# Patient Record
Sex: Female | Born: 1945
Health system: Southern US, Community
[De-identification: ages and names within clinical notes are randomized; demographics above are authoritative.]

## PROBLEM LIST (undated history)

## (undated) DIAGNOSIS — R112 Nausea with vomiting, unspecified: Secondary | ICD-10-CM

## (undated) DIAGNOSIS — K635 Polyp of colon: Secondary | ICD-10-CM

## (undated) DIAGNOSIS — K52839 Microscopic colitis, unspecified: Secondary | ICD-10-CM

## (undated) DIAGNOSIS — E7800 Pure hypercholesterolemia, unspecified: Secondary | ICD-10-CM

## (undated) DIAGNOSIS — Z9889 Other specified postprocedural states: Secondary | ICD-10-CM

## (undated) DIAGNOSIS — K602 Anal fissure, unspecified: Secondary | ICD-10-CM

## (undated) DIAGNOSIS — K648 Other hemorrhoids: Secondary | ICD-10-CM

## (undated) DIAGNOSIS — M1990 Unspecified osteoarthritis, unspecified site: Secondary | ICD-10-CM

## (undated) DIAGNOSIS — K589 Irritable bowel syndrome without diarrhea: Secondary | ICD-10-CM

## (undated) DIAGNOSIS — N83202 Unspecified ovarian cyst, left side: Secondary | ICD-10-CM

## (undated) DIAGNOSIS — E039 Hypothyroidism, unspecified: Secondary | ICD-10-CM

## (undated) DIAGNOSIS — R55 Syncope and collapse: Secondary | ICD-10-CM

## (undated) HISTORY — DX: Polyp of colon: K63.5

## (undated) HISTORY — PX: GYNECOLOGIC CRYOSURGERY: SHX857

## (undated) HISTORY — PX: CATARACT EXTRACTION, BILATERAL: SHX1313

## (undated) HISTORY — DX: Hypothyroidism, unspecified: E03.9

## (undated) HISTORY — DX: Unspecified osteoarthritis, unspecified site: M19.90

## (undated) HISTORY — PX: EYE SURGERY: SHX253

## (undated) HISTORY — DX: Pure hypercholesterolemia, unspecified: E78.00

## (undated) HISTORY — PX: BREAST BIOPSY: SHX20

## (undated) HISTORY — DX: Anal fissure, unspecified: K60.2

## (undated) HISTORY — PX: COLPOSCOPY: SHX161

## (undated) HISTORY — PX: COLONOSCOPY: SHX174

## (undated) HISTORY — PX: UPPER GI ENDOSCOPY: SHX6162

## (undated) HISTORY — DX: Irritable bowel syndrome, unspecified: K58.9

---

## 1998-06-03 ENCOUNTER — Other Ambulatory Visit: Admission: RE | Admit: 1998-06-03 | Discharge: 1998-06-03 | Payer: Self-pay | Admitting: Obstetrics and Gynecology

## 1999-07-05 ENCOUNTER — Other Ambulatory Visit: Admission: RE | Admit: 1999-07-05 | Discharge: 1999-07-05 | Payer: Self-pay | Admitting: Obstetrics and Gynecology

## 1999-09-16 ENCOUNTER — Encounter: Payer: Self-pay | Admitting: Obstetrics and Gynecology

## 1999-09-16 ENCOUNTER — Encounter: Admission: RE | Admit: 1999-09-16 | Discharge: 1999-09-16 | Payer: Self-pay | Admitting: Obstetrics and Gynecology

## 2000-04-20 ENCOUNTER — Encounter: Payer: Self-pay | Admitting: Obstetrics and Gynecology

## 2000-04-20 ENCOUNTER — Encounter: Admission: RE | Admit: 2000-04-20 | Discharge: 2000-04-20 | Payer: Self-pay | Admitting: Obstetrics and Gynecology

## 2000-07-26 ENCOUNTER — Other Ambulatory Visit: Admission: RE | Admit: 2000-07-26 | Discharge: 2000-07-26 | Payer: Self-pay | Admitting: Obstetrics and Gynecology

## 2001-05-16 ENCOUNTER — Encounter: Admission: RE | Admit: 2001-05-16 | Discharge: 2001-05-16 | Payer: Self-pay | Admitting: Obstetrics and Gynecology

## 2001-05-16 ENCOUNTER — Encounter: Payer: Self-pay | Admitting: Obstetrics and Gynecology

## 2001-10-09 ENCOUNTER — Other Ambulatory Visit: Admission: RE | Admit: 2001-10-09 | Discharge: 2001-10-09 | Payer: Self-pay | Admitting: Obstetrics and Gynecology

## 2002-05-27 ENCOUNTER — Encounter: Admission: RE | Admit: 2002-05-27 | Discharge: 2002-05-27 | Payer: Self-pay | Admitting: Obstetrics and Gynecology

## 2002-05-27 ENCOUNTER — Encounter: Payer: Self-pay | Admitting: Obstetrics and Gynecology

## 2002-12-26 ENCOUNTER — Other Ambulatory Visit: Admission: RE | Admit: 2002-12-26 | Discharge: 2002-12-26 | Payer: Self-pay | Admitting: Obstetrics and Gynecology

## 2003-06-12 ENCOUNTER — Encounter: Payer: Self-pay | Admitting: Obstetrics and Gynecology

## 2003-06-12 ENCOUNTER — Encounter: Admission: RE | Admit: 2003-06-12 | Discharge: 2003-06-12 | Payer: Self-pay | Admitting: Obstetrics and Gynecology

## 2004-01-02 ENCOUNTER — Other Ambulatory Visit: Admission: RE | Admit: 2004-01-02 | Discharge: 2004-01-02 | Payer: Self-pay | Admitting: Obstetrics and Gynecology

## 2004-07-22 ENCOUNTER — Encounter: Admission: RE | Admit: 2004-07-22 | Discharge: 2004-07-22 | Payer: Self-pay | Admitting: Obstetrics and Gynecology

## 2005-01-24 ENCOUNTER — Other Ambulatory Visit: Admission: RE | Admit: 2005-01-24 | Discharge: 2005-01-24 | Payer: Self-pay | Admitting: Addiction Medicine

## 2005-08-15 ENCOUNTER — Encounter: Admission: RE | Admit: 2005-08-15 | Discharge: 2005-08-15 | Payer: Self-pay | Admitting: Obstetrics and Gynecology

## 2006-01-25 ENCOUNTER — Other Ambulatory Visit: Admission: RE | Admit: 2006-01-25 | Discharge: 2006-01-25 | Payer: Self-pay | Admitting: Obstetrics and Gynecology

## 2006-04-24 ENCOUNTER — Ambulatory Visit: Payer: Self-pay | Admitting: Internal Medicine

## 2006-05-24 ENCOUNTER — Ambulatory Visit: Payer: Self-pay | Admitting: Internal Medicine

## 2006-05-24 ENCOUNTER — Encounter (INDEPENDENT_AMBULATORY_CARE_PROVIDER_SITE_OTHER): Payer: Self-pay | Admitting: Specialist

## 2006-09-07 ENCOUNTER — Encounter: Admission: RE | Admit: 2006-09-07 | Discharge: 2006-09-07 | Payer: Self-pay | Admitting: Obstetrics and Gynecology

## 2006-09-19 ENCOUNTER — Emergency Department (HOSPITAL_COMMUNITY): Admission: EM | Admit: 2006-09-19 | Discharge: 2006-09-19 | Payer: Self-pay | Admitting: Family Medicine

## 2007-02-20 ENCOUNTER — Other Ambulatory Visit: Admission: RE | Admit: 2007-02-20 | Discharge: 2007-02-20 | Payer: Self-pay | Admitting: Obstetrics and Gynecology

## 2007-04-12 ENCOUNTER — Other Ambulatory Visit: Admission: RE | Admit: 2007-04-12 | Discharge: 2007-04-12 | Payer: Self-pay | Admitting: Obstetrics and Gynecology

## 2007-07-10 ENCOUNTER — Ambulatory Visit: Payer: Self-pay | Admitting: Internal Medicine

## 2007-07-10 LAB — CONVERTED CEMR LAB: Tissue Transglutaminase Ab, IgA: 0.2 units (ref <7)

## 2007-10-08 ENCOUNTER — Encounter: Admission: RE | Admit: 2007-10-08 | Discharge: 2007-10-08 | Payer: Self-pay | Admitting: Obstetrics and Gynecology

## 2007-12-13 DIAGNOSIS — K589 Irritable bowel syndrome without diarrhea: Secondary | ICD-10-CM

## 2007-12-13 DIAGNOSIS — E0590 Thyrotoxicosis, unspecified without thyrotoxic crisis or storm: Secondary | ICD-10-CM | POA: Insufficient documentation

## 2008-03-20 ENCOUNTER — Other Ambulatory Visit: Admission: RE | Admit: 2008-03-20 | Discharge: 2008-03-20 | Payer: Self-pay | Admitting: Obstetrics and Gynecology

## 2008-10-27 ENCOUNTER — Encounter: Admission: RE | Admit: 2008-10-27 | Discharge: 2008-10-27 | Payer: Self-pay | Admitting: Obstetrics and Gynecology

## 2009-03-26 ENCOUNTER — Other Ambulatory Visit: Admission: RE | Admit: 2009-03-26 | Discharge: 2009-03-26 | Payer: Self-pay | Admitting: Obstetrics and Gynecology

## 2009-03-26 ENCOUNTER — Ambulatory Visit: Payer: Self-pay | Admitting: Obstetrics and Gynecology

## 2009-03-26 ENCOUNTER — Encounter: Payer: Self-pay | Admitting: Obstetrics and Gynecology

## 2009-05-12 ENCOUNTER — Ambulatory Visit: Payer: Self-pay | Admitting: Obstetrics and Gynecology

## 2009-06-08 ENCOUNTER — Ambulatory Visit: Payer: Self-pay | Admitting: Surgery

## 2009-06-08 ENCOUNTER — Ambulatory Visit (HOSPITAL_COMMUNITY): Admission: RE | Admit: 2009-06-08 | Discharge: 2009-06-08 | Payer: Self-pay | Admitting: Internal Medicine

## 2009-06-08 ENCOUNTER — Encounter (INDEPENDENT_AMBULATORY_CARE_PROVIDER_SITE_OTHER): Payer: Self-pay | Admitting: Internal Medicine

## 2009-10-29 ENCOUNTER — Encounter: Admission: RE | Admit: 2009-10-29 | Discharge: 2009-10-29 | Payer: Self-pay | Admitting: Obstetrics and Gynecology

## 2010-04-08 ENCOUNTER — Ambulatory Visit: Payer: Self-pay | Admitting: Obstetrics and Gynecology

## 2010-04-08 ENCOUNTER — Other Ambulatory Visit: Admission: RE | Admit: 2010-04-08 | Discharge: 2010-04-08 | Payer: Self-pay | Admitting: Obstetrics and Gynecology

## 2010-04-13 ENCOUNTER — Ambulatory Visit: Payer: Self-pay | Admitting: Obstetrics and Gynecology

## 2010-10-23 ENCOUNTER — Other Ambulatory Visit: Payer: Self-pay | Admitting: Obstetrics and Gynecology

## 2010-10-23 DIAGNOSIS — Z1239 Encounter for other screening for malignant neoplasm of breast: Secondary | ICD-10-CM

## 2010-11-18 ENCOUNTER — Ambulatory Visit
Admission: RE | Admit: 2010-11-18 | Discharge: 2010-11-18 | Disposition: A | Discharge status: Home / Self Care | Payer: 59 | Source: Ambulatory Visit | Attending: Obstetrics and Gynecology | Admitting: Obstetrics and Gynecology

## 2010-11-18 DIAGNOSIS — Z1239 Encounter for other screening for malignant neoplasm of breast: Secondary | ICD-10-CM

## 2010-12-23 LAB — IFOBT (OCCULT BLOOD): IFOBT: POSITIVE

## 2010-12-28 ENCOUNTER — Encounter: Payer: Self-pay | Admitting: Internal Medicine

## 2011-01-04 NOTE — Letter (Signed)
Summary: New Patient letter  [facility]  [address] [place], [place] [address]   Phone: [phone]  Fax: [phone]       12/28/2010 MRN: [id number]  Vanessa Larsen [address] [place], [place]  [address]  Dear Vanessa Larsen,  Welcome to the Gastroenterology Division at [facility]    You are scheduled to see Dr.  Juanda Chance on 02-18-11 at 1:30pm on the 3rd floor at [facility], [address]  We ask that you try to arrive at our office 15 minutes prior to your appointment time to allow for check-in.  We would like you to complete the enclosed self-administered evaluation form prior to your visit and bring it with you on the day of your appointment.  We will review it with you.  Also, please bring a complete list of all your medications or, if you prefer, bring the medication bottles and we will list them.  Please bring your insurance card so that we may make a copy of it.  If your insurance requires a referral to see a specialist, please bring your referral form from your primary care physician.  Co-payments are due at the time of your visit and may be paid by cash, check or credit card.     Your office visit will consist of a consult with your physician (includes a physical exam), any laboratory testing he/she may order, scheduling of any necessary diagnostic testing (e.g. x-ray, ultrasound, CT-scan), and scheduling of a procedure (e.g. Endoscopy, Colonoscopy) if required.  Please allow enough time on your schedule to allow for any/all of these possibilities.    If you cannot keep your appointment, please call [phone] to cancel or reschedule prior to your appointment date.  This allows [place] the opportunity to schedule an appointment for another patient in need of care.  If you do not cancel or reschedule by 5 p.m. the business day prior to your appointment date, you will be charged a $50.00 late cancellation/no-show fee.    Thank you for choosing [facility] for your medical needs.  We appreciate the opportunity to care for you.  Please visit [place] at our website  to learn more about our practice.                     Sincerely,                                                             The Gastroenterology Division

## 2011-02-15 NOTE — Assessment & Plan Note (Signed)
[facility]                         GASTROENTEROLOGY OFFICE NOTE   NAME:Vanessa Larsen, Vanessa Larsen                      MRN:          [id number]  DATE:07/10/2007                            DOB:          [birth year]    Vanessa Larsen is a 65 year old white female who we saw last year for screening  colonoscopy. She has a history of irritable bowel syndrome and was  having some diarrhea and frequency of stools. Her colon exam was  essentially normal including random biopsies of the colon which showed  no evidence of microscopic colitis. Her next colonoscopy would be in 10  years; that is in 2017. She comes back today because she has had at  least five episodes of incontinent diarrheal stool at night. Last  episode was this past summer; prior to that, the year before. It happens  very rarely, and there is usually no warning. She has not been able to  correlate with any type of food. During the day, she is having regular  bowel movements on a high-fiber diet. She is physically active. Her  weight has been stable. She does not smoke, does not drink excess  alcohol. She denies abdominal pain. There is some family history of  irregular bowel habits in both mother and father; both died of  pancreatic CA and gastric CA, respectively. There is no lactose allergy.  Vanessa Larsen has noted, however, that Splenda gives her diarrhea.   MEDICATIONS:  1. Levoxyl 75 mcg daily.  2. Multivitamins daily.  3. Calcium supplements.   PHYSICAL EXAMINATION:  Blood pressure 116/88, pulse 84, weight 144  pounds. She was healthy appearing, alert and oriented.  LUNGS:  Were clear to auscultation.  COR:  With normal S1 and normal S2.  ABDOMEN:  Soft. There was good muscle support. Normal active bowel  sounds. No tenderness. Liver edge at costal margin. No scars.  RECTAL EXAM:  Shows normal appearing anal area. Rectal tone was normal  to slightly decreased. There was a moderate amount of Hemoccult  negative  stool in the ampulla.   Vanessa Larsen also gives me history of rectal trauma during her first  pregnancy, associated with episiotomy. She apparently had some rectal  injury but really did not have any problems with continence in the last  one or two years.   IMPRESSION:  A 65 year old white  female with episodes of nocturnal  diarrhea and incontinence. It is probably related to either  bacterial  overgrowth combined with slightly incompetent rectal sphincter or to an  irritable bowel syndrome, although the diarrhea of IBS usually occurs  during the day. Since there is some family history of irregular bowel  habits and food intolerance, I would check her for celiac sprue by  obtaining  tissue glutaminase.  I suggested a high-fiber diet, but she  has already been on it. I would like for her take  to probiotics to  normalize her bacterial flora.   PLAN:  1. Tissue glutaminase level today.  2. Probiotic, Align, 1 p.o. daily.  3. Increase fiber in the food. If the symptoms become more frequent  and more bothersome, we could put her on a bedtime dose of      antispasmodic or on additional fiber such as Benefiber a tablespoon      daily. She prefers just to stay on probiotics at this time.     Hedwig Morton. Juanda Chance, MD  Electronically Signed    DMB/MedQ  DD: 07/10/2007  DT: 07/10/2007  Job #: [id number]   cc:   Reuel Boom L. Eda Paschal, M.D.

## 2011-02-18 ENCOUNTER — Ambulatory Visit: Payer: 59 | Admitting: Internal Medicine

## 2011-03-01 ENCOUNTER — Encounter: Payer: Self-pay | Admitting: Internal Medicine

## 2011-03-07 ENCOUNTER — Ambulatory Visit (INDEPENDENT_AMBULATORY_CARE_PROVIDER_SITE_OTHER): Payer: 59 | Admitting: Internal Medicine

## 2011-03-07 ENCOUNTER — Encounter: Payer: Self-pay | Admitting: Internal Medicine

## 2011-03-07 VITALS — BP 130/84 | HR 76 | Ht 69.5 in | Wt 143.6 lb

## 2011-03-07 DIAGNOSIS — R195 Other fecal abnormalities: Secondary | ICD-10-CM

## 2011-03-07 DIAGNOSIS — K602 Anal fissure, unspecified: Secondary | ICD-10-CM

## 2011-03-07 MED ORDER — PEG-KCL-NACL-NASULF-NA ASC-C 100 G PO SOLR: 1.0000 | Freq: Once | ORAL | Status: AC

## 2011-03-07 NOTE — Patient Instructions (Addendum)
You have been scheduled for an endoscopy and colonoscopy. Please follow the written instructions given to you at your visit today. Please pick up your Moviprep in the next 2-3 days. Dr Eric Form

## 2011-03-07 NOTE — Progress Notes (Signed)
Vanessa Larsen 1945/11/19 MRN [id number]     History of Present Illness:  This is a 65 year old white female with a history of irritable bowel syndrome with predominant diarrhea and a family history of pancreatic cancer in her father and esophageal cancer in her mother. She had a positive home stool test for occult blood. We saw her in 2007 for a colonoscopy which was essentially normal except for a slightly decreased rectal sphincter tone. She denies any visible blood per rectum. She takes ibuprofen 200 mg almost daily. She denies gastroesophageal reflux, dysphagia or abdominal pain. Her sister had H. pylori gastropathy. Her recent hemoglobin was 14.2   Past Medical History  Diagnosis Date  . IBS (irritable bowel syndrome)    Past Surgical History  Procedure Date  . None     reports that she has quit smoking. She does not have any smokeless tobacco history on file. She reports that she drinks alcohol. She reports that she does not use illicit drugs. family history includes Diabetes in her cousin; Esophageal cancer in her mother; Heart disease in her father and paternal grandmother; and Pancreatic cancer in her father.  There is no history of Colon cancer. No Known Allergies      Review of Systems: Denies dysphagia, odynophagia, shortness of breath or chest pain. Positive for occasional fecal urgency. Negative for hematochezia  The remainder of the 10  point ROS is negative except as outlined in H&P   Physical Exam: General appearance  Well developed, in no distress. Eyes- non icteric. HEENT nontraumatic, normocephalic. Mouth no lesions, tongue papillated, no cheilosis. Neck supple without adenopathy, thyroid not enlarged, no carotid bruits, no JVD. Lungs Clear to auscultation bilaterally. Cor normal S1 normal S2, regular rhythm , no murmur,  quiet precordium. Abdomen soft relaxed abdomen with normoactive bowel sounds. No focal tenderness or mass. Rectal anoscopic exam reveals  no internal hemorrhoids. Rectal sphincter tone was normal to slightly decreased. Stool is dark and slightly Hemoccult-positive. There is a small anal fissure at 9:00 which measures 1 cm and it is superficial. It is not associated with pain. Extremities no pedal edema. Skin no lesions. Neurological alert and oriented x 3. Psychological normal mood and affect.  Assessment and Plan:  Problem # 1 Heme-positive stool on home test and on today's exam. It could be related to the presence of a small anal fissure. It could also be related to ibuprofen gastropathy. She is asymptomatic. It has been 5 years since her last colonoscopy. Her mother died of esophageal cancer. Because of her high risk for GI-related cancer, we will proceed with an upper endoscopy and colonoscopy. We would plan to check H. pylori as well as for Barrett's esophagus. Anal fissure seems to be asymptomatic.   03/07/2011 Lina Sar

## 2011-04-08 ENCOUNTER — Encounter: Payer: Self-pay | Admitting: Internal Medicine

## 2011-04-08 ENCOUNTER — Ambulatory Visit (AMBULATORY_SURGERY_CENTER): Payer: 59 | Admitting: Internal Medicine

## 2011-04-08 VITALS — BP 125/70 | HR 79 | Temp 99.0°F | Resp 12

## 2011-04-08 DIAGNOSIS — K219 Gastro-esophageal reflux disease without esophagitis: Secondary | ICD-10-CM

## 2011-04-08 DIAGNOSIS — R195 Other fecal abnormalities: Secondary | ICD-10-CM

## 2011-04-08 DIAGNOSIS — D126 Benign neoplasm of colon, unspecified: Secondary | ICD-10-CM

## 2011-04-08 DIAGNOSIS — K921 Melena: Secondary | ICD-10-CM

## 2011-04-08 DIAGNOSIS — Z800 Family history of malignant neoplasm of digestive organs: Secondary | ICD-10-CM

## 2011-04-08 MED ORDER — SODIUM CHLORIDE 0.9 % IV SOLN: 500.0000 mL | INTRAVENOUS | Status: DC

## 2011-04-08 MED ORDER — HYOSCYAMINE SULFATE 0.125 MG SL SUBL: 0.1250 mg | SUBLINGUAL_TABLET | SUBLINGUAL | Status: AC | PRN

## 2011-04-08 NOTE — Patient Instructions (Addendum)
FOLLOW DISCHARGE INSTRUCTIONS (BLUE & GREEN SHEETS)   INFORMATION ON POLYPS AND HIGH FIBER DIET GIVEN    AWAIT BIOPSY RESULTS   LEVSIN FOR IBS SENT TO YOUR PHARMACY AS ORDERED BY DR Juanda Chance

## 2011-04-11 ENCOUNTER — Telehealth: Payer: Self-pay

## 2011-04-11 NOTE — Telephone Encounter (Signed)
I called the hm # and the a.m. Picked up but not ID on it.  I then called the pt's cell # which she ID herself as Vanessa Larsen.  I left message for the pt to call if any questions or concerns.  MAW

## 2011-04-12 ENCOUNTER — Encounter: Payer: 59 | Admitting: Obstetrics and Gynecology

## 2011-04-14 ENCOUNTER — Encounter (INDEPENDENT_AMBULATORY_CARE_PROVIDER_SITE_OTHER): Payer: 59 | Admitting: Obstetrics and Gynecology

## 2011-04-14 ENCOUNTER — Other Ambulatory Visit (HOSPITAL_COMMUNITY)
Admission: RE | Admit: 2011-04-14 | Discharge: 2011-04-14 | Disposition: A | Discharge status: Home / Self Care | Payer: 59 | Source: Ambulatory Visit | Attending: Obstetrics and Gynecology | Admitting: Obstetrics and Gynecology

## 2011-04-14 ENCOUNTER — Other Ambulatory Visit: Payer: Self-pay | Admitting: Obstetrics and Gynecology

## 2011-04-14 DIAGNOSIS — Z124 Encounter for screening for malignant neoplasm of cervix: Secondary | ICD-10-CM | POA: Insufficient documentation

## 2011-04-14 DIAGNOSIS — Z01419 Encounter for gynecological examination (general) (routine) without abnormal findings: Secondary | ICD-10-CM

## 2011-04-16 ENCOUNTER — Encounter: Payer: Self-pay | Admitting: Internal Medicine

## 2011-04-26 ENCOUNTER — Other Ambulatory Visit: Payer: 59

## 2011-04-26 ENCOUNTER — Other Ambulatory Visit: Payer: Self-pay

## 2011-04-26 ENCOUNTER — Ambulatory Visit (INDEPENDENT_AMBULATORY_CARE_PROVIDER_SITE_OTHER): Payer: 59 | Admitting: Obstetrics and Gynecology

## 2011-04-26 DIAGNOSIS — N83339 Acquired atrophy of ovary and fallopian tube, unspecified side: Secondary | ICD-10-CM

## 2011-04-26 DIAGNOSIS — N83209 Unspecified ovarian cyst, unspecified side: Secondary | ICD-10-CM

## 2011-04-26 DIAGNOSIS — D259 Leiomyoma of uterus, unspecified: Secondary | ICD-10-CM

## 2011-04-26 NOTE — Progress Notes (Signed)
The patient came back today for follow up ultrasound because of an adnexal mass. On ultrasound today she is a normal uterus with a thin endometrial cavity a 1.6 mm. Her previous myoma that we've also been watching is now gone. I suspected disappeared because of lack of estrogen. On her right ovary there is no cyst whatsoever. On her left ovary she continues to have a thick walled cyst. It has decreased in size however and is now 4 x 5 mm. It is negative for PFT. Her cul-de-sac is free of fluid.   plan she was reassured that the cyst is stable. She will call with new symptoms. We will rescan her in approximately one year.

## 2011-04-28 ENCOUNTER — Telehealth: Payer: Self-pay | Admitting: Internal Medicine

## 2011-04-28 NOTE — Telephone Encounter (Signed)
Patient received a letter with Colonoscopy results and she wants the EGD results. Please, advise.

## 2011-05-01 ENCOUNTER — Encounter: Payer: Self-pay | Admitting: Internal Medicine

## 2011-05-03 NOTE — Telephone Encounter (Signed)
I have mailed letter several days ago.

## 2011-05-17 ENCOUNTER — Telehealth: Payer: Self-pay | Admitting: Internal Medicine

## 2011-05-17 NOTE — Telephone Encounter (Signed)
Patient just received her results from [facility] and has questions. 1. Since her mother had esophageal cancer, should she be on something for reflux even though she does not have any symptoms of reflux? 2. Should she have another EGD because of her mother's cancer? 3. Since she had heme + stools and nothing was found to account for it, should she avoid NSAIDS?

## 2011-05-19 NOTE — Telephone Encounter (Signed)
I have spoken with the pt at length . All questions answered. DB

## 2011-06-22 ENCOUNTER — Encounter: Payer: Self-pay | Admitting: Obstetrics and Gynecology

## 2011-10-26 ENCOUNTER — Other Ambulatory Visit: Payer: Self-pay | Admitting: Obstetrics and Gynecology

## 2011-10-26 DIAGNOSIS — Z1231 Encounter for screening mammogram for malignant neoplasm of breast: Secondary | ICD-10-CM

## 2011-11-24 ENCOUNTER — Ambulatory Visit
Admission: RE | Admit: 2011-11-24 | Discharge: 2011-11-24 | Disposition: A | Discharge status: Home / Self Care | Payer: 59 | Source: Ambulatory Visit | Attending: Obstetrics and Gynecology | Admitting: Obstetrics and Gynecology

## 2011-11-24 DIAGNOSIS — Z1231 Encounter for screening mammogram for malignant neoplasm of breast: Secondary | ICD-10-CM

## 2012-05-02 ENCOUNTER — Encounter: Payer: 59 | Admitting: Obstetrics and Gynecology

## 2012-05-22 ENCOUNTER — Ambulatory Visit (INDEPENDENT_AMBULATORY_CARE_PROVIDER_SITE_OTHER): Payer: 59 | Admitting: Obstetrics and Gynecology

## 2012-05-22 ENCOUNTER — Encounter: Payer: Self-pay | Admitting: Obstetrics and Gynecology

## 2012-05-22 VITALS — BP 130/84 | Ht 69.0 in | Wt 140.0 lb

## 2012-05-22 DIAGNOSIS — R102 Pelvic and perineal pain: Secondary | ICD-10-CM

## 2012-05-22 DIAGNOSIS — N949 Unspecified condition associated with female genital organs and menstrual cycle: Secondary | ICD-10-CM

## 2012-05-22 DIAGNOSIS — Z01419 Encounter for gynecological examination (general) (routine) without abnormal findings: Secondary | ICD-10-CM

## 2012-05-22 NOTE — Patient Instructions (Signed)
Schedule pelvic ultrasound

## 2012-05-22 NOTE — Progress Notes (Signed)
Patient came to see me today for her annual GYN exam. She is doing well without HRT in terms of menopausal symptoms but she does have vaginal dryness causing dyspareunia. She is up-to-date on mammograms. She does get intermittent lower abdominal discomfort that we can't distinguish from IBS versus ovarian origin. She is having no vaginal bleeding. She has had 3-to 4 normal bone densities with her last one in August, 2010. In 1989 she had cervical dysplasia and had cryosurgery. After that she had another abnormal Pap and had a normal colposcopy and Dr. Guilford Shi  Read her pap and felt it  Was reactive atypia. Since 1992 she has had yearly normal Pap smears including one in 2012. She does her lab work through her PCP.  Physical examination: Vanessa Larsen present. HEENT within normal limits. Neck: Thyroid not large. No masses. Supraclavicular nodes: not enlarged. Breasts: Examined in both sitting and lying  position. No skin changes and no masses. Abdomen: Soft no guarding rebound or masses or hernia. Pelvic: External: Within normal limits. BUS: Within normal limits. Vaginal:within normal limits. Poor  estrogen effect. No evidence of cystocele rectocele or enterocele. Cervix: clean. Uterus: Normal size and shape. Adnexa: No masses. Rectovaginal exam: Confirmatory and negative. Extremities: Within normal limits.  Assessment: #1. Abdominal discomfort #2. Symptomatic atrophic vaginitis #3. Previous CIN  Plan: Pelvic ultrasound. Start patient on Estrace vaginal cream 1 g at bedtime in her vagina for 2 weeks and then 3 times a week.The new Pap smear guidelines were discussed with the patient. No pap done.

## 2012-05-28 ENCOUNTER — Ambulatory Visit (INDEPENDENT_AMBULATORY_CARE_PROVIDER_SITE_OTHER): Payer: 59 | Admitting: Obstetrics and Gynecology

## 2012-05-28 ENCOUNTER — Ambulatory Visit (INDEPENDENT_AMBULATORY_CARE_PROVIDER_SITE_OTHER): Payer: 59

## 2012-05-28 DIAGNOSIS — N949 Unspecified condition associated with female genital organs and menstrual cycle: Secondary | ICD-10-CM

## 2012-05-28 DIAGNOSIS — R102 Pelvic and perineal pain: Secondary | ICD-10-CM

## 2012-05-28 DIAGNOSIS — N83209 Unspecified ovarian cyst, unspecified side: Secondary | ICD-10-CM

## 2012-05-28 DIAGNOSIS — N831 Corpus luteum cyst of ovary, unspecified side: Secondary | ICD-10-CM

## 2012-05-28 NOTE — Patient Instructions (Addendum)
Continue yearly ultrasounds.

## 2012-05-28 NOTE — Progress Notes (Signed)
Patient came back today for followup ultrasound. When she had her ultrasound in 2007 all was normal except for small fibroid. In 2009 all was  normal except for a small thick walled cyst on her left ovary which was  slightly greater than 1 cm with a cystic lumen. We felt it was just a follicle. It did not have blood flow. In 2011 it remained stable at 1.1 cm. In 2012 it was seen and  was only 4 mm. Today it is still present and was about 9 mm. It remains avascular. The rest of her ultrasound reveals a normal uterus without the previous myoma being seen. Her endometrial echo is 0.9 mm. Right ovary is completely normal. Her left ovary is normal except what we think is this persistent follicle or small cyst. Her cul-de-sac is free of fluid. We had a very long discussion about it. I've told her that I do not believe this could be an ovarian cancer based on stability over 4 years, no vascularity, and the fact that it  gets smaller on occasion. We discussed doing a CA 125 but also discussed a false positive result. We discussed surgical removal and the fact that benefits are outweighed by risks. We are both comfortable watching it and she will have another ultrasound in one year.

## 2012-10-23 ENCOUNTER — Other Ambulatory Visit: Payer: Self-pay | Admitting: Gynecology

## 2012-10-23 ENCOUNTER — Other Ambulatory Visit: Payer: Self-pay | Admitting: Obstetrics and Gynecology

## 2012-10-23 DIAGNOSIS — Z1231 Encounter for screening mammogram for malignant neoplasm of breast: Secondary | ICD-10-CM

## 2012-11-28 ENCOUNTER — Ambulatory Visit
Admission: RE | Admit: 2012-11-28 | Discharge: 2012-11-28 | Disposition: A | Discharge status: Home / Self Care | Payer: 59 | Source: Ambulatory Visit | Attending: Gynecology | Admitting: Gynecology

## 2012-11-28 DIAGNOSIS — Z1231 Encounter for screening mammogram for malignant neoplasm of breast: Secondary | ICD-10-CM

## 2012-12-03 ENCOUNTER — Other Ambulatory Visit: Payer: Self-pay | Admitting: *Deleted

## 2012-12-03 ENCOUNTER — Other Ambulatory Visit: Payer: Self-pay | Admitting: Gynecology

## 2012-12-03 DIAGNOSIS — R928 Other abnormal and inconclusive findings on diagnostic imaging of breast: Secondary | ICD-10-CM

## 2012-12-03 DIAGNOSIS — N6320 Unspecified lump in the left breast, unspecified quadrant: Secondary | ICD-10-CM

## 2012-12-06 ENCOUNTER — Other Ambulatory Visit: Payer: Self-pay | Admitting: Dermatology

## 2012-12-11 ENCOUNTER — Ambulatory Visit
Admission: RE | Admit: 2012-12-11 | Discharge: 2012-12-11 | Disposition: A | Discharge status: Home / Self Care | Payer: 59 | Source: Ambulatory Visit | Attending: Gynecology | Admitting: Gynecology

## 2012-12-11 ENCOUNTER — Other Ambulatory Visit: Payer: Self-pay | Admitting: Gynecology

## 2012-12-11 DIAGNOSIS — R928 Other abnormal and inconclusive findings on diagnostic imaging of breast: Secondary | ICD-10-CM

## 2012-12-11 DIAGNOSIS — N6320 Unspecified lump in the left breast, unspecified quadrant: Secondary | ICD-10-CM

## 2012-12-11 HISTORY — PX: BREAST BIOPSY: SHX20

## 2013-01-17 ENCOUNTER — Other Ambulatory Visit: Payer: Self-pay | Admitting: Dermatology

## 2013-04-10 ENCOUNTER — Other Ambulatory Visit: Payer: Self-pay | Admitting: *Deleted

## 2013-04-10 DIAGNOSIS — N83209 Unspecified ovarian cyst, unspecified side: Secondary | ICD-10-CM

## 2013-05-14 ENCOUNTER — Other Ambulatory Visit: Payer: Self-pay | Admitting: Gynecology

## 2013-05-14 DIAGNOSIS — N63 Unspecified lump in unspecified breast: Secondary | ICD-10-CM

## 2013-05-24 ENCOUNTER — Ambulatory Visit: Payer: Self-pay | Admitting: Gynecology

## 2013-05-24 ENCOUNTER — Other Ambulatory Visit: Payer: Self-pay

## 2013-05-29 ENCOUNTER — Encounter: Payer: Self-pay | Admitting: Gynecology

## 2013-05-29 ENCOUNTER — Ambulatory Visit (INDEPENDENT_AMBULATORY_CARE_PROVIDER_SITE_OTHER): Payer: 59 | Admitting: Gynecology

## 2013-05-29 ENCOUNTER — Ambulatory Visit (INDEPENDENT_AMBULATORY_CARE_PROVIDER_SITE_OTHER): Payer: 59

## 2013-05-29 VITALS — BP 114/72 | Ht 69.0 in | Wt 140.0 lb

## 2013-05-29 DIAGNOSIS — N83209 Unspecified ovarian cyst, unspecified side: Secondary | ICD-10-CM

## 2013-05-29 DIAGNOSIS — N839 Noninflammatory disorder of ovary, fallopian tube and broad ligament, unspecified: Secondary | ICD-10-CM

## 2013-05-29 DIAGNOSIS — N83339 Acquired atrophy of ovary and fallopian tube, unspecified side: Secondary | ICD-10-CM

## 2013-05-29 DIAGNOSIS — N952 Postmenopausal atrophic vaginitis: Secondary | ICD-10-CM

## 2013-05-29 DIAGNOSIS — N950 Postmenopausal bleeding: Secondary | ICD-10-CM

## 2013-05-29 DIAGNOSIS — Z01419 Encounter for gynecological examination (general) (routine) without abnormal findings: Secondary | ICD-10-CM

## 2013-05-29 NOTE — Progress Notes (Addendum)
Vanessa Larsen 09/06/1946 [id number]        67 y.o.  G3P3003 for annual exam.  Former patient of Dr. Eda Paschal. Several issues noted below.  Past medical history,surgical history, medications, allergies, family history and social history were all reviewed and documented in the EPIC chart.  ROS:  Performed and pertinent positives and negatives are included in the history, assessment and plan .  Exam: [name] assistant Filed Vitals:   05/29/13 1001  BP: 114/72  Height: 5\' 9"  (1.753 m)  Weight: 140 lb (63.504 kg)   General appearance  Normal Skin grossly normal Head/Neck normal with no cervical or supraclavicular adenopathy thyroid normal Lungs  clear Cardiac RR, without RMG Abdominal  soft, nontender, without masses, organomegaly or hernia Breasts  examined lying and sitting without masses, retractions, discharge or axillary adenopathy. Pelvic  Ext/BUS/vagina  normal with atrophic changes  Cervix  normal with atrophic changes   Uterus  anteverted, normal size, shape and contour, midline and mobile nontender   Adnexa  Without masses or tenderness    Anus and perineum  normal   Rectovaginal  normal sphincter tone without palpated masses or tenderness.    Assessment/Plan:  67 y.o. G106P3003 female for annual exam.   1. Left ovarian cyst.. Patient has long history of small cyst on the left ovary that has been followed with annual ultrasounds. Ultrasound today shows uterus normal size and echotexture. Endometrial echo 1.2 mm. Right ovary normal. Left ovary with continued presence of thick walled cyst 10 mm mean. Unchanged from prior ultrasound. Reviewed with patient. Felt to be a physiologic change stable. Options to stop screening versus continuing discussed. The patient very strongly would prefer continuing with annual ultrasounds for her peace of mind and we'll rediscuss on an annual basis. 2. Atrophic genital changes/post menopausal.  Had discussed with Dr. Eda Paschal previously given  Estrace cream which she never started. She does not want prescription intervention. Not having significant hot flashes or night sweats. Options for OTC moisturizers and lubricants reviewed. No history of bleeding. Endometrial echo on ultrasound 1.2 mm. Report any vaginal bleeding. Followup if wants to reconsider medication for atrophic changes. 3. Pap smear 2012. No Pap smear done today. History of abnormal Pap smear with cryo-surgery a number of years ago which on review was felt to be a reactive atypia with endometriosis of the cervix and not dysplasia. Regardless her Pap smears have all been normal since. Recommend repeat next year at 3 year interval. 4. Mammography 2014. Continue with annual mammography. SBE monthly reviewed. 5. DEXA 05/2009 normal. Has several normal reports in the past. Recommend repeat at five-year intervals next year. Increase calcium vitamin D reviewed with recommended dosages. Did recommend having vitamin D level checked when she gets routine blood work done. 6. Colonoscopy 2012. Repeat at their recommended interval. 7. Health maintenance. Patient actively followed by Dr. Clelia Croft. No blood work done today as it's all done through his office. Followup one year, sooner as needed.  Note: This document was prepared with digital dictation and possible smart phrase technology. Any transcriptional errors that result from this process are unintentional.   Vanessa Lords MD, 10:32 AM 05/29/2013

## 2013-05-29 NOTE — Patient Instructions (Signed)
Follow up in one year for annual exam 

## 2013-05-30 LAB — URINALYSIS W MICROSCOPIC + REFLEX CULTURE
Bilirubin Urine: NEGATIVE
Glucose, UA: NEGATIVE mg/dL
Hgb urine dipstick: NEGATIVE
Ketones, ur: NEGATIVE mg/dL
Protein, ur: NEGATIVE mg/dL

## 2013-06-12 ENCOUNTER — Other Ambulatory Visit: Payer: Self-pay | Admitting: Gynecology

## 2013-06-12 ENCOUNTER — Other Ambulatory Visit: Payer: Self-pay

## 2013-06-12 DIAGNOSIS — N63 Unspecified lump in unspecified breast: Secondary | ICD-10-CM

## 2013-06-13 ENCOUNTER — Ambulatory Visit
Admission: RE | Admit: 2013-06-13 | Discharge: 2013-06-13 | Disposition: A | Discharge status: Home / Self Care | Payer: 59 | Source: Ambulatory Visit | Attending: Gynecology | Admitting: Gynecology

## 2013-06-13 DIAGNOSIS — N63 Unspecified lump in unspecified breast: Secondary | ICD-10-CM

## 2013-10-08 ENCOUNTER — Other Ambulatory Visit: Payer: Self-pay | Admitting: Gynecology

## 2013-10-08 DIAGNOSIS — N63 Unspecified lump in unspecified breast: Secondary | ICD-10-CM

## 2013-12-05 ENCOUNTER — Ambulatory Visit
Admission: RE | Admit: 2013-12-05 | Discharge: 2013-12-05 | Disposition: A | Discharge status: Home / Self Care | Payer: 59 | Source: Ambulatory Visit | Attending: Gynecology | Admitting: Gynecology

## 2013-12-05 DIAGNOSIS — N63 Unspecified lump in unspecified breast: Secondary | ICD-10-CM

## 2013-12-05 IMAGING — MG MM DIAG BREAST TOMO BILATERAL
9 of 12 series · 9 of 28 positions shown · non-contrast
Comparison: Multiple prior exams

CLINICAL DATA: Six-month follow-up. Patient had left breast biopsy
revealing benign stromal fibrosis in [DATE]. No current
complaints.

EXAM:
DIGITAL DIAGNOSTIC  BILATERAL MAMMOGRAM WITH CAD
DIGITAL BREAST TOMOSYNTHESIS
Digital breast tomosynthesis images are acquired in two projections.
These images are reviewed in combination with the digital mammogram,
confirming the findings below.

[R CC]
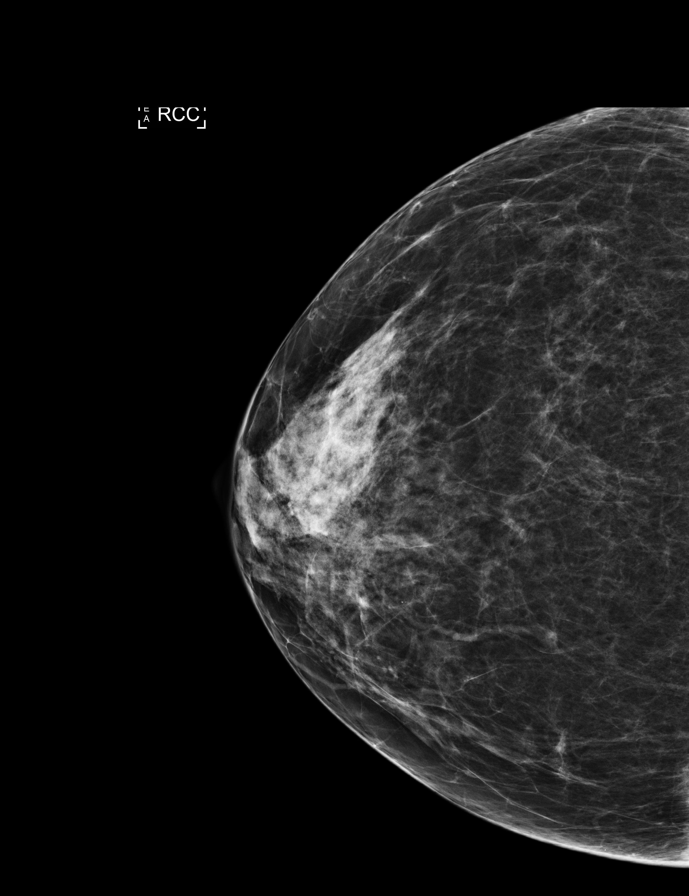

[R MLO synth-2D]
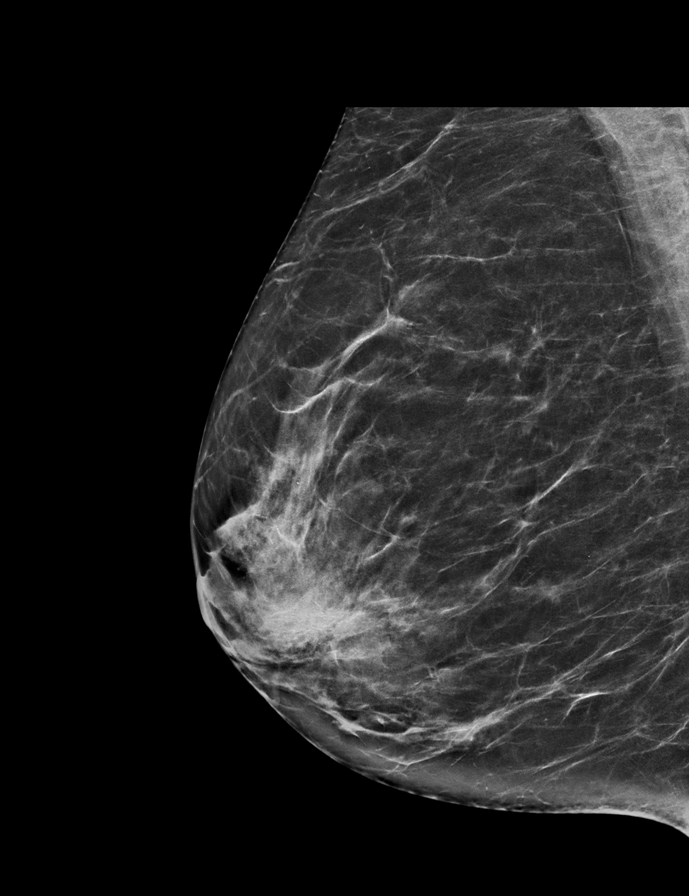

[L CC]
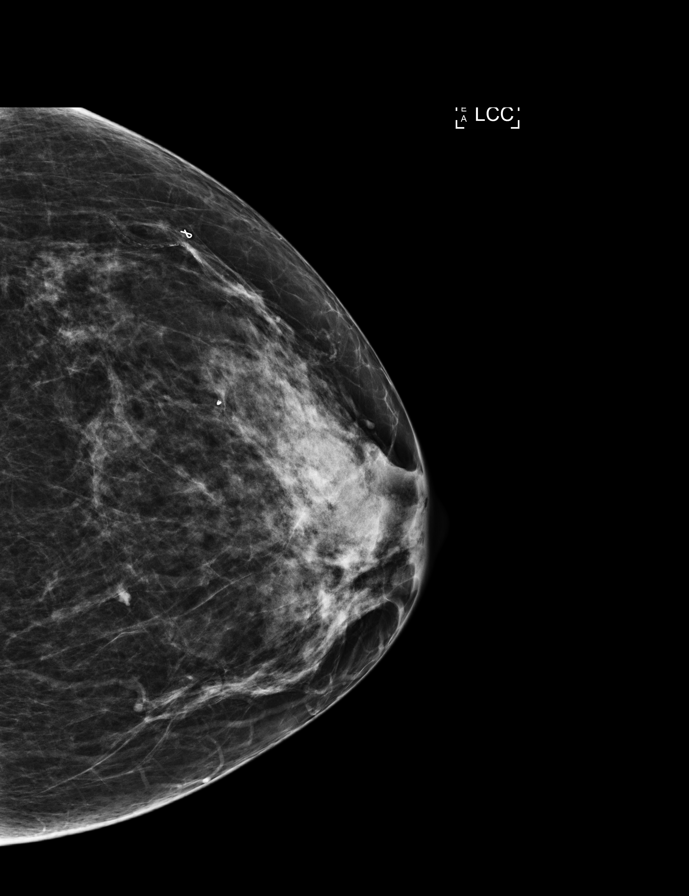

[R CC synth-2D]
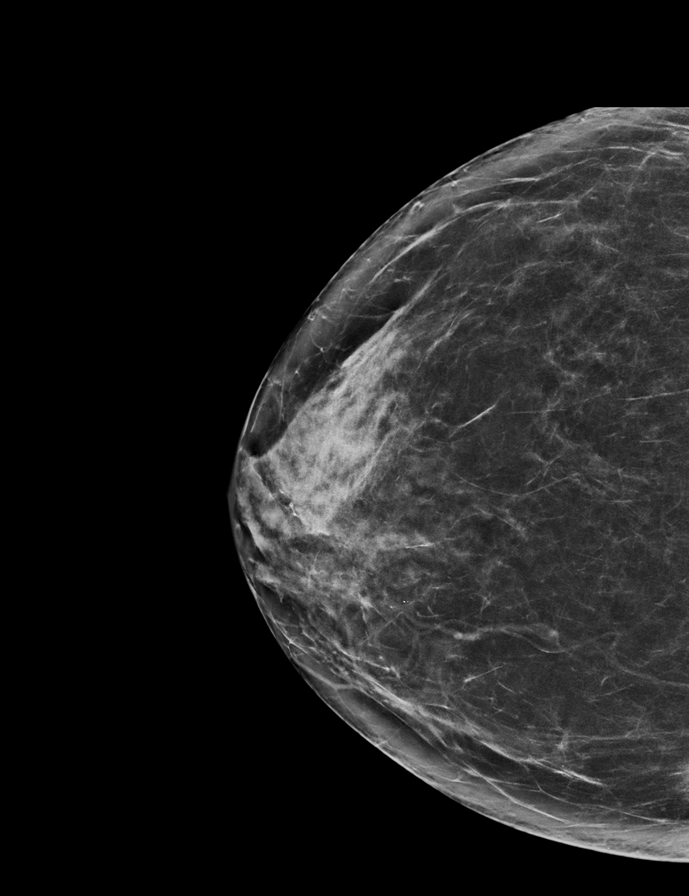

[R MLO]
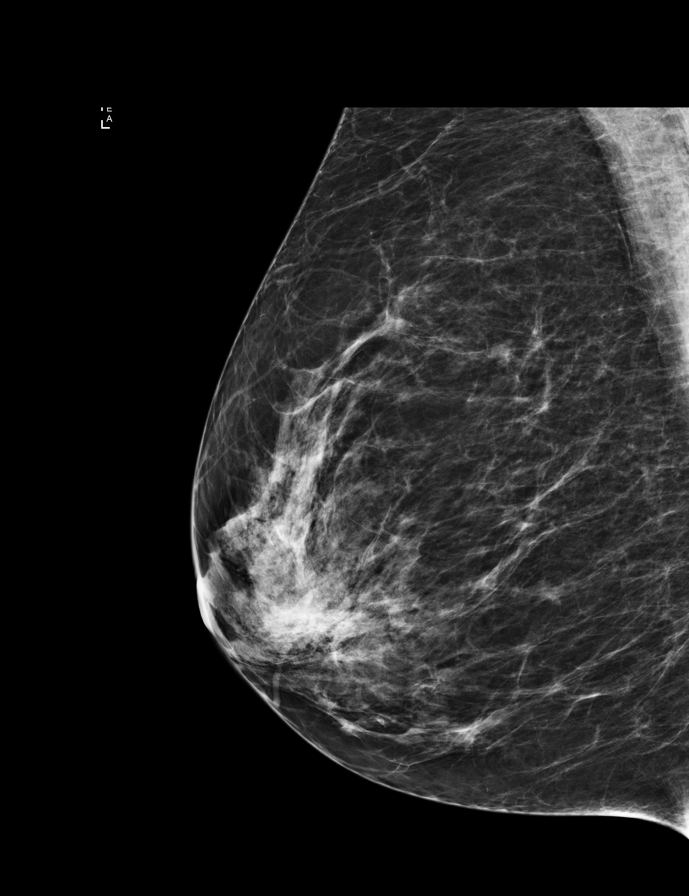

[L MLO synth-2D]
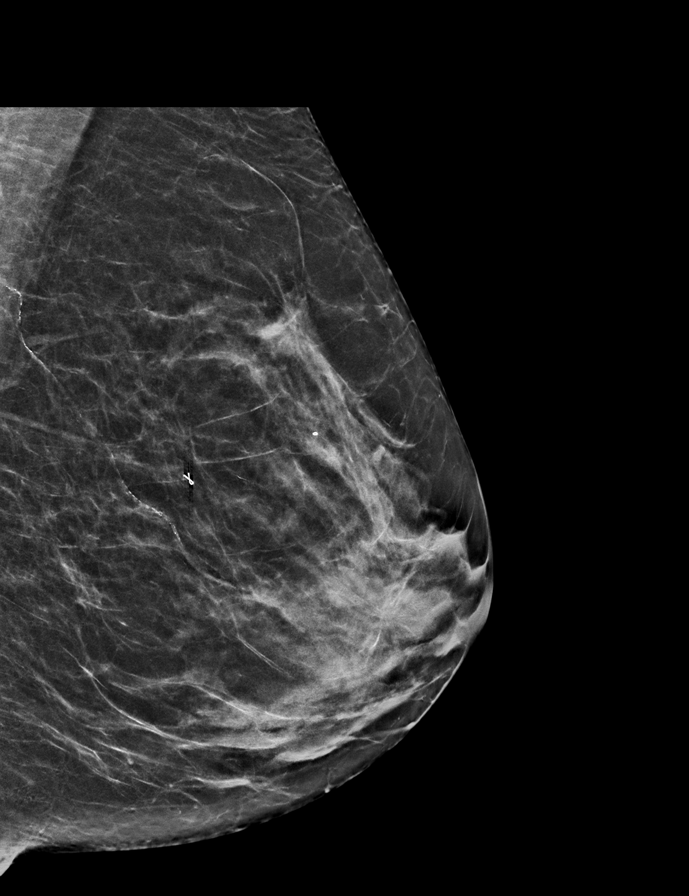

[L MLO]
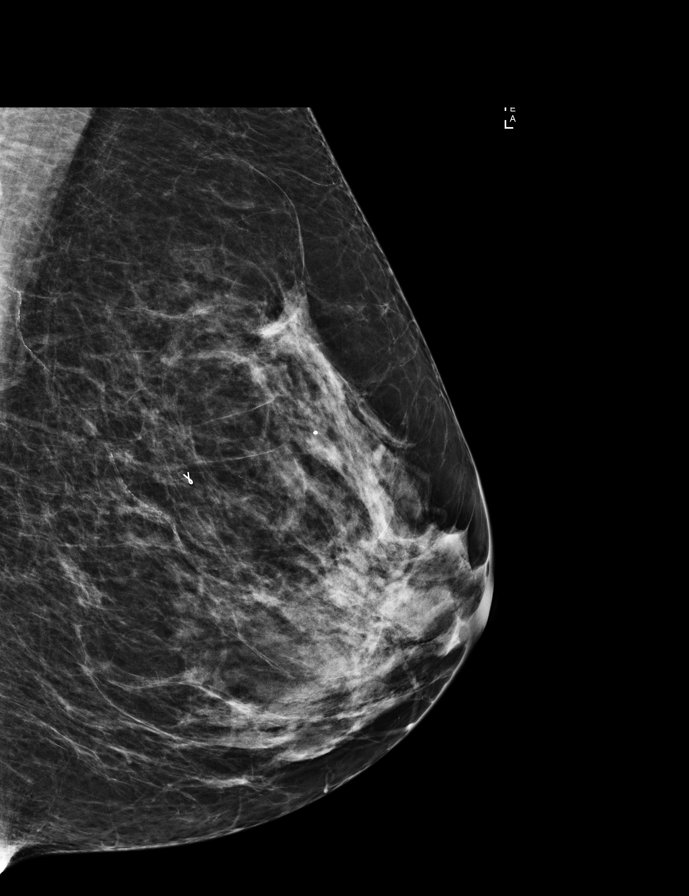

[L CC synth-2D]
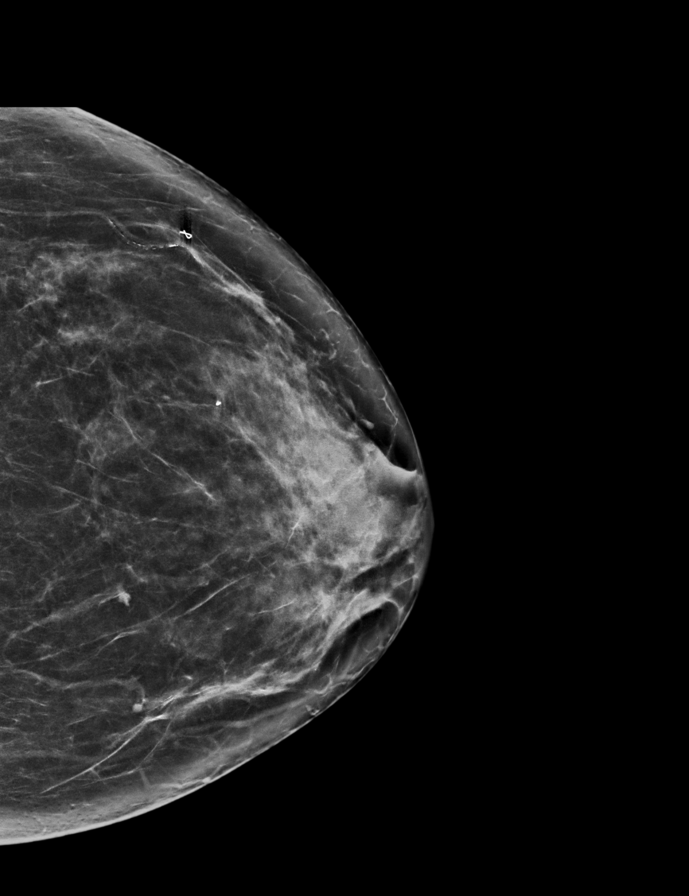

[L CC tomo · tomo slice 34/67.0]
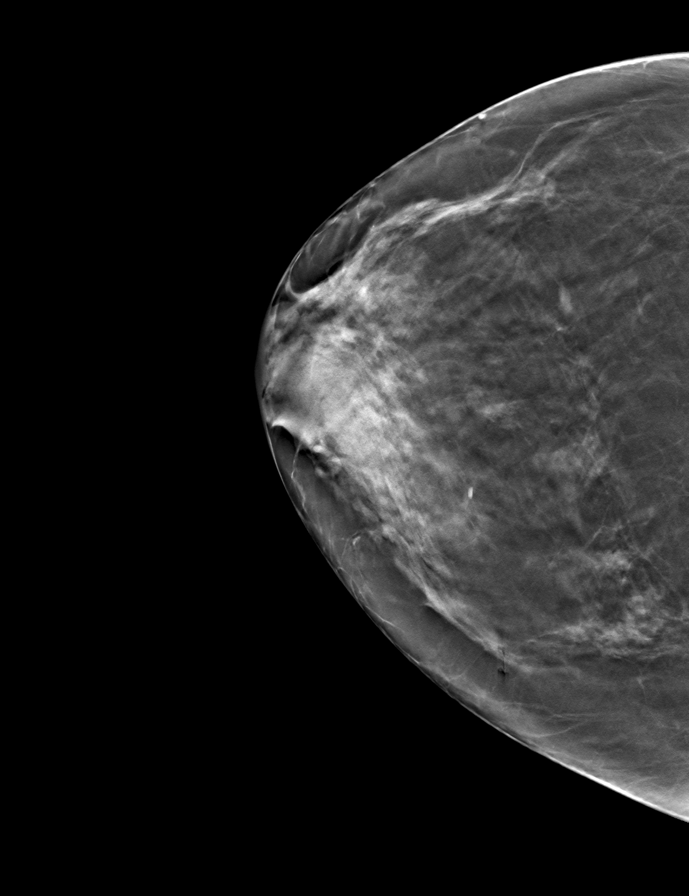

[9 of 28 positions shown; findings below may reference images not displayed]

ACR Breast Density Category c: The breast tissue is heterogeneously
dense, which may obscure small masses.
FINDINGS: There are no discrete masses, areas of architectural distortion or
suspicious calcifications. There is no evidence of malignancy. No
change from the prior exams.

Mammographic images were processed with CAD.
IMPRESSION: No evidence of malignancy.  Previous benign left breast biopsy.

RECOMMENDATION:
Screening mammogram in one year.(Code:[DQ])

I have discussed the findings and recommendations with the patient.
Results were also provided in writing at the conclusion of the
visit. If applicable, a reminder letter will be sent to the patient
regarding the next appointment.

BI-RADS CATEGORY  1: Negative

## 2014-03-24 ENCOUNTER — Other Ambulatory Visit: Payer: Self-pay | Admitting: *Deleted

## 2014-03-24 DIAGNOSIS — N83209 Unspecified ovarian cyst, unspecified side: Secondary | ICD-10-CM

## 2014-06-18 ENCOUNTER — Ambulatory Visit (INDEPENDENT_AMBULATORY_CARE_PROVIDER_SITE_OTHER): Payer: 59

## 2014-06-18 ENCOUNTER — Ambulatory Visit (INDEPENDENT_AMBULATORY_CARE_PROVIDER_SITE_OTHER): Payer: 59 | Admitting: Gynecology

## 2014-06-18 ENCOUNTER — Encounter: Payer: Self-pay | Admitting: Gynecology

## 2014-06-18 ENCOUNTER — Other Ambulatory Visit (HOSPITAL_COMMUNITY)
Admission: RE | Admit: 2014-06-18 | Discharge: 2014-06-18 | Disposition: A | Discharge status: Home / Self Care | Payer: 59 | Source: Ambulatory Visit | Attending: Gynecology | Admitting: Gynecology

## 2014-06-18 ENCOUNTER — Other Ambulatory Visit: Payer: Self-pay | Admitting: Gynecology

## 2014-06-18 VITALS — BP 120/70 | Ht 69.5 in | Wt 140.0 lb

## 2014-06-18 DIAGNOSIS — N83209 Unspecified ovarian cyst, unspecified side: Secondary | ICD-10-CM

## 2014-06-18 DIAGNOSIS — Z01419 Encounter for gynecological examination (general) (routine) without abnormal findings: Secondary | ICD-10-CM | POA: Insufficient documentation

## 2014-06-18 DIAGNOSIS — N952 Postmenopausal atrophic vaginitis: Secondary | ICD-10-CM

## 2014-06-18 DIAGNOSIS — Z23 Encounter for immunization: Secondary | ICD-10-CM

## 2014-06-18 DIAGNOSIS — N83339 Acquired atrophy of ovary and fallopian tube, unspecified side: Secondary | ICD-10-CM

## 2014-06-18 NOTE — Addendum Note (Signed)
Addended by: Nelva Nay on: 06/18/2014 11:21 AM   Modules accepted: Orders

## 2014-06-18 NOTE — Progress Notes (Signed)
Vanessa Larsen 1946-09-24 [id number]        68 y.o.  G3P3003 for annual exam.  Several issues noted below.  Past medical history,surgical history, problem list, medications, allergies, family history and social history were all reviewed and documented as reviewed in the EPIC chart.  ROS:  12 system ROS performed with pertinent positives and negatives included in the history, assessment and plan.   Additional significant findings :  none   Exam: [name] Counsellor Vitals:   06/18/14 1029  BP: 120/70  Height: 5' 9.5" (1.765 m)  Weight: 140 lb (63.504 kg)   General appearance:  Normal affect, orientation and appearance. Skin: Grossly normal HEENT: Without gross lesions.  No cervical or supraclavicular adenopathy. Thyroid normal.  Lungs:  Clear without wheezing, rales or rhonchi Cardiac: RR, without RMG Abdominal:  Soft, nontender, without masses, guarding, rebound, organomegaly or hernia Breasts:  Examined lying and sitting without masses, retractions, discharge or axillary adenopathy. Pelvic:  Ext/BUS/vagina with atrophic changes  Cervix with atrophic changes. Pap done  Uterus anteverted, normal size, shape and contour, midline and mobile nontender   Adnexa  Without masses or tenderness    Anus and perineum  Normal   Rectovaginal  Normal sphincter tone without palpated masses or tenderness.    Assessment/Plan:  68 y.o. G3P3003 female for annual exam .   1. Postmenopausal/atrophic genital changes. Patient doing well without significant hot flushes, night sweats, vaginal dryness or dyspareunia. No vaginal bleeding. Continue to monitor. Report any vaginal bleeding. 2. History of left ovarian cyst being followed by serial ultrasounds. Ultrasound today shows uterus to be normal size and echotexture. Endometrial echo of 0.9 mm. Right ovary normal. Left ovary with thick walled 8 x 5 mm avascular cyst. Unchanged from prior studies. Cul-de-sac negative. Everything points towards a benign  stable left ovarian cyst. Options to stop screening versus continued monitoring reviewed. Patient will make up her mind from a peace of mind standpoint whether she wants to stop screening annually or continue. At this point no special follow up needed. 3. Pap smear 2012. Pap smear done today. History of abnormal Pap smear with cryo-surgery a number of years ago which on review was felt to be a reactive atypia with endometriosis of the cervix and not dysplasia. Options to stop screening altogether as she is over the age of 8 versus less frequent screening intervals reviewed. Will readdress on an annual basis. 4. Mammography 12/2013. Continue with annual mammography. SBE monthly reviewed. 5. DEXA normal 2010. Plan repeat DEXA now at 5 year interval. Calcium/vitamin D recommendations reviewed. I asked patient to have her vitamin D level checked next time she has routine blood drawn at Dr. Raul Del office. 6. Colonoscopy 2012. Repeat at their recommended interval. 7. Health maintenance. No routine blood work done as this is done at her primary physician's office. Follow up for DEXA otherwise one year, sooner as needed.     Anastasio Auerbach MD, 11:09 AM 06/18/2014

## 2014-06-18 NOTE — Patient Instructions (Signed)
Follow up for bone density as scheduled.  You may obtain a copy of any labs that were done today by logging onto MyChart as outlined in the instructions provided with your AVS (after visit summary). The office will not call with normal lab results but certainly if there are any significant abnormalities then we will contact you.   Health Maintenance, Female A healthy lifestyle and preventative care can promote health and wellness.  Maintain regular health, dental, and eye exams.  Eat a healthy diet. Foods like vegetables, fruits, whole grains, low-fat dairy products, and lean protein foods contain the nutrients you need without too many calories. Decrease your intake of foods high in solid fats, added sugars, and salt. Get information about a proper diet from your caregiver, if necessary.  Regular physical exercise is one of the most important things you can do for your health. Most adults should get at least 150 minutes of moderate-intensity exercise (any activity that increases your heart rate and causes you to sweat) each week. In addition, most adults need muscle-strengthening exercises on 2 or more days a week.   Maintain a healthy weight. The body mass index (BMI) is a screening tool to identify possible weight problems. It provides an estimate of body fat based on height and weight. Your caregiver can help determine your BMI, and can help you achieve or maintain a healthy weight. For adults 20 years and older:  A BMI below 18.5 is considered underweight.  A BMI of 18.5 to 24.9 is normal.  A BMI of 25 to 29.9 is considered overweight.  A BMI of 30 and above is considered obese.  Maintain normal blood lipids and cholesterol by exercising and minimizing your intake of saturated fat. Eat a balanced diet with plenty of fruits and vegetables. Blood tests for lipids and cholesterol should begin at age 20 and be repeated every 5 years. If your lipid or cholesterol levels are high, you are over  50, or you are a high risk for heart disease, you may need your cholesterol levels checked more frequently.Ongoing high lipid and cholesterol levels should be treated with medicines if diet and exercise are not effective.  If you smoke, find out from your caregiver how to quit. If you do not use tobacco, do not start.  Lung cancer screening is recommended for adults aged 55 80 years who are at high risk for developing lung cancer because of a history of smoking. Yearly low-dose computed tomography (CT) is recommended for people who have at least a 30-pack-year history of smoking and are a current smoker or have quit within the past 15 years. A pack year of smoking is smoking an average of 1 pack of cigarettes a day for 1 year (for example: 1 pack a day for 30 years or 2 packs a day for 15 years). Yearly screening should continue until the smoker has stopped smoking for at least 15 years. Yearly screening should also be stopped for people who develop a health problem that would prevent them from having lung cancer treatment.  If you are pregnant, do not drink alcohol. If you are breastfeeding, be very cautious about drinking alcohol. If you are not pregnant and choose to drink alcohol, do not exceed 1 drink per day. One drink is considered to be 12 ounces (355 mL) of beer, 5 ounces (148 mL) of wine, or 1.5 ounces (44 mL) of liquor.  Avoid use of street drugs. Do not share needles with anyone. Ask for help   if you need support or instructions about stopping the use of drugs.  High blood pressure causes heart disease and increases the risk of stroke. Blood pressure should be checked at least every 1 to 2 years. Ongoing high blood pressure should be treated with medicines, if weight loss and exercise are not effective.  If you are 55 to 67 years old, ask your caregiver if you should take aspirin to prevent strokes.  Diabetes screening involves taking a blood sample to check your fasting blood sugar level.  This should be done once every 3 years, after age 45, if you are within normal weight and without risk factors for diabetes. Testing should be considered at a younger age or be carried out more frequently if you are overweight and have at least 1 risk factor for diabetes.  Breast cancer screening is essential preventative care for women. You should practice "breast self-awareness." This means understanding the normal appearance and feel of your breasts and may include breast self-examination. Any changes detected, no matter how small, should be reported to a caregiver. Women in their 20s and 30s should have a clinical breast exam (CBE) by a caregiver as part of a regular health exam every 1 to 3 years. After age 40, women should have a CBE every year. Starting at age 40, women should consider having a mammogram (breast X-ray) every year. Women who have a family history of breast cancer should talk to their caregiver about genetic screening. Women at a high risk of breast cancer should talk to their caregiver about having an MRI and a mammogram every year.  Breast cancer gene (BRCA)-related cancer risk assessment is recommended for women who have family members with BRCA-related cancers. BRCA-related cancers include breast, ovarian, tubal, and peritoneal cancers. Having family members with these cancers may be associated with an increased risk for harmful changes (mutations) in the breast cancer genes BRCA1 and BRCA2. Results of the assessment will determine the need for genetic counseling and BRCA1 and BRCA2 testing.  The Pap test is a screening test for cervical cancer. Women should have a Pap test starting at age 21. Between ages 21 and 29, Pap tests should be repeated every 2 years. Beginning at age 30, you should have a Pap test every 3 years as long as the past 3 Pap tests have been normal. If you had a hysterectomy for a problem that was not cancer or a condition that could lead to cancer, then you no  longer need Pap tests. If you are between ages 65 and 70, and you have had normal Pap tests going back 10 years, you no longer need Pap tests. If you have had past treatment for cervical cancer or a condition that could lead to cancer, you need Pap tests and screening for cancer for at least 20 years after your treatment. If Pap tests have been discontinued, risk factors (such as a new sexual partner) need to be reassessed to determine if screening should be resumed. Some women have medical problems that increase the chance of getting cervical cancer. In these cases, your caregiver may recommend more frequent screening and Pap tests.  The human papillomavirus (HPV) test is an additional test that may be used for cervical cancer screening. The HPV test looks for the virus that can cause the cell changes on the cervix. The cells collected during the Pap test can be tested for HPV. The HPV test could be used to screen women aged 30 years and older, and   should be used in women of any age who have unclear Pap test results. After the age of 30, women should have HPV testing at the same frequency as a Pap test.  Colorectal cancer can be detected and often prevented. Most routine colorectal cancer screening begins at the age of 50 and continues through age 75. However, your caregiver may recommend screening at an earlier age if you have risk factors for colon cancer. On a yearly basis, your caregiver may provide home test kits to check for hidden blood in the stool. Use of a small camera at the end of a tube, to directly examine the colon (sigmoidoscopy or colonoscopy), can detect the earliest forms of colorectal cancer. Talk to your caregiver about this at age 50, when routine screening begins. Direct examination of the colon should be repeated every 5 to 10 years through age 75, unless early forms of pre-cancerous polyps or small growths are found.  Hepatitis C blood testing is recommended for all people born from  1945 through 1965 and any individual with known risks for hepatitis C.  Practice safe sex. Use condoms and avoid high-risk sexual practices to reduce the spread of sexually transmitted infections (STIs). Sexually active women aged 25 and younger should be checked for Chlamydia, which is a common sexually transmitted infection. Older women with new or multiple partners should also be tested for Chlamydia. Testing for other STIs is recommended if you are sexually active and at increased risk.  Osteoporosis is a disease in which the bones lose minerals and strength with aging. This can result in serious bone fractures. The risk of osteoporosis can be identified using a bone density scan. Women ages 65 and over and women at risk for fractures or osteoporosis should discuss screening with their caregivers. Ask your caregiver whether you should be taking a calcium supplement or vitamin D to reduce the rate of osteoporosis.  Menopause can be associated with physical symptoms and risks. Hormone replacement therapy is available to decrease symptoms and risks. You should talk to your caregiver about whether hormone replacement therapy is right for you.  Use sunscreen. Apply sunscreen liberally and repeatedly throughout the day. You should seek shade when your shadow is shorter than you. Protect yourself by wearing long sleeves, pants, a wide-brimmed hat, and sunglasses year round, whenever you are outdoors.  Notify your caregiver of new moles or changes in moles, especially if there is a change in shape or color. Also notify your caregiver if a mole is larger than the size of a pencil eraser.  Stay current with your immunizations. Document Released: 04/04/2011 Document Revised: 01/14/2013 Document Reviewed: 04/04/2011 [employer] Patient Information 2014 [employer].   

## 2014-06-19 LAB — URINALYSIS W MICROSCOPIC + REFLEX CULTURE
BILIRUBIN URINE: NEGATIVE
CASTS: NONE SEEN
CRYSTALS: NONE SEEN
GLUCOSE, UA: NEGATIVE mg/dL
HGB URINE DIPSTICK: NEGATIVE
KETONES UR: NEGATIVE mg/dL
NITRITE: NEGATIVE
PH: 6 (ref 5.0–8.0)
Protein, ur: NEGATIVE mg/dL
SPECIFIC GRAVITY, URINE: 1.012 (ref 1.005–1.030)
Squamous Epithelial / LPF: NONE SEEN
Urobilinogen, UA: 0.2 mg/dL (ref 0.0–1.0)

## 2014-06-19 LAB — CYTOLOGY - PAP

## 2014-06-21 LAB — URINE CULTURE: Colony Count: >=100000

## 2014-06-23 ENCOUNTER — Other Ambulatory Visit: Payer: Self-pay | Admitting: Gynecology

## 2014-06-23 MED ORDER — SULFAMETHOXAZOLE-TMP DS 800-160 MG PO TABS: 1.0000 | ORAL_TABLET | Freq: Two times a day (BID) | ORAL | Status: DC

## 2014-07-03 ENCOUNTER — Other Ambulatory Visit: Payer: Self-pay | Admitting: Gynecology

## 2014-07-03 ENCOUNTER — Ambulatory Visit (INDEPENDENT_AMBULATORY_CARE_PROVIDER_SITE_OTHER): Payer: 59

## 2014-07-03 DIAGNOSIS — Z01419 Encounter for gynecological examination (general) (routine) without abnormal findings: Secondary | ICD-10-CM

## 2014-07-03 DIAGNOSIS — Z780 Asymptomatic menopausal state: Secondary | ICD-10-CM

## 2014-07-03 DIAGNOSIS — Z13820 Encounter for screening for osteoporosis: Secondary | ICD-10-CM

## 2014-07-03 DIAGNOSIS — N951 Menopausal and female climacteric states: Secondary | ICD-10-CM

## 2014-08-04 ENCOUNTER — Encounter: Payer: Self-pay | Admitting: Gynecology

## 2014-11-10 ENCOUNTER — Other Ambulatory Visit: Payer: Self-pay

## 2014-11-10 DIAGNOSIS — Z1231 Encounter for screening mammogram for malignant neoplasm of breast: Secondary | ICD-10-CM

## 2014-12-08 ENCOUNTER — Ambulatory Visit
Admission: RE | Admit: 2014-12-08 | Discharge: 2014-12-08 | Disposition: A | Discharge status: Home / Self Care | Payer: 59 | Source: Ambulatory Visit

## 2014-12-08 DIAGNOSIS — Z1231 Encounter for screening mammogram for malignant neoplasm of breast: Secondary | ICD-10-CM

## 2015-04-29 ENCOUNTER — Encounter: Payer: Self-pay | Admitting: Internal Medicine

## 2015-06-25 ENCOUNTER — Encounter: Payer: Self-pay | Admitting: Gynecology

## 2015-06-25 ENCOUNTER — Ambulatory Visit (INDEPENDENT_AMBULATORY_CARE_PROVIDER_SITE_OTHER): Payer: 59 | Admitting: Gynecology

## 2015-06-25 VITALS — BP 120/76 | Ht 69.0 in | Wt 139.0 lb

## 2015-06-25 DIAGNOSIS — Z23 Encounter for immunization: Secondary | ICD-10-CM

## 2015-06-25 DIAGNOSIS — Z01419 Encounter for gynecological examination (general) (routine) without abnormal findings: Secondary | ICD-10-CM

## 2015-06-25 DIAGNOSIS — N952 Postmenopausal atrophic vaginitis: Secondary | ICD-10-CM

## 2015-06-25 NOTE — Progress Notes (Signed)
Vanessa Larsen 07/27/1946 [id number]        68 y.o.  G3P3003 for annual exam.  Doing well without complaints.  Past medical history,surgical history, problem list, medications, allergies, family history and social history were all reviewed and documented as reviewed in the EPIC chart.  ROS:  Performed with pertinent positives and negatives included in the history, assessment and plan.   Additional significant findings :  none   Exam: [name] Counsellor Vitals:   06/25/15 1203  BP: 120/76  Height: 5\' 9"  (1.753 m)  Weight: 139 lb (63.05 kg)   General appearance:  Normal affect, orientation and appearance. Skin: Grossly normal HEENT: Without gross lesions.  No cervical or supraclavicular adenopathy. Thyroid normal.  Lungs:  Clear without wheezing, rales or rhonchi Cardiac: RR, without RMG Abdominal:  Soft, nontender, without masses, guarding, rebound, organomegaly or hernia Breasts:  Examined lying and sitting without masses, retractions, discharge or axillary adenopathy. Pelvic:  Ext/BUS/vagina with atrophic changes  Cervix with atrophic changes  Uterus anteverted, normal size, shape and contour, midline and mobile nontender   Adnexa  Without masses or tenderness    Anus and perineum  Normal   Rectovaginal  Normal sphincter tone without palpated masses or tenderness.    Assessment/Plan:  69 y.o. G3P3003 female for annual exam.   1. Postmenopausal/atrophic genital changes. Patient doing well without significant hot flushes, night sweats, vaginal dryness or any vaginal bleeding. Continue to monitor and report any vaginal bleeding. 2. History of small left ovarian cyst 8 x 5 mm, avascular on ultrasound last year. Unchanged from prior studies several years before. We discussed this and were both comfortable with observation. 3. Pap smear 2015. No Pap smear done today. History of cryosurgery a number of years ago with review of records showing a reactive atypia with endometriosis of  the cervix with no true dysplasia. Options to stop screening versus less frequent screening intervals reviewed and we will readdress on an annual basis. 4. Mammography's 12/2014. Continue with annual mammography. SBE monthly reviewed. 5. DEXA 2015 normal. Plan repeat at 5 year interval. Increased calcium vitamin D reviewed. 6. Colonoscopy 2012. Repeat at their recommended interval. 7. Health maintenance. No routine blood work done as she has this done at her primary physician's office. Follow up on year, sooner as needed.   Anastasio Auerbach MD, 12:46 PM 06/25/2015

## 2015-06-25 NOTE — Addendum Note (Signed)
Addended by: Nelva Nay on: 06/25/2015 01:02 PM   Modules accepted: Orders

## 2015-06-25 NOTE — Patient Instructions (Signed)

## 2015-07-27 ENCOUNTER — Telehealth: Payer: Self-pay | Admitting: Internal Medicine

## 2015-07-27 NOTE — Telephone Encounter (Signed)
Reviewed with Dr. Ellie Lunch that pt received versed 7mg  and fentanyl 62mcg for her last procedure.

## 2015-11-27 ENCOUNTER — Other Ambulatory Visit: Payer: Self-pay

## 2015-11-27 DIAGNOSIS — Z1231 Encounter for screening mammogram for malignant neoplasm of breast: Secondary | ICD-10-CM

## 2015-12-22 ENCOUNTER — Ambulatory Visit
Admission: RE | Admit: 2015-12-22 | Discharge: 2015-12-22 | Disposition: A | Discharge status: Home / Self Care | Payer: 59 | Source: Ambulatory Visit

## 2015-12-22 DIAGNOSIS — Z1231 Encounter for screening mammogram for malignant neoplasm of breast: Secondary | ICD-10-CM

## 2016-02-11 ENCOUNTER — Other Ambulatory Visit: Payer: Self-pay | Admitting: Internal Medicine

## 2016-02-11 DIAGNOSIS — E785 Hyperlipidemia, unspecified: Secondary | ICD-10-CM

## 2016-02-17 ENCOUNTER — Other Ambulatory Visit: Payer: 59

## 2016-02-22 ENCOUNTER — Other Ambulatory Visit: Payer: 59

## 2016-03-24 ENCOUNTER — Ambulatory Visit
Admission: RE | Admit: 2016-03-24 | Discharge: 2016-03-24 | Disposition: A | Discharge status: Home / Self Care | Payer: No Typology Code available for payment source | Source: Ambulatory Visit | Attending: Internal Medicine | Admitting: Internal Medicine

## 2016-03-24 DIAGNOSIS — E785 Hyperlipidemia, unspecified: Secondary | ICD-10-CM

## 2016-06-30 ENCOUNTER — Ambulatory Visit (INDEPENDENT_AMBULATORY_CARE_PROVIDER_SITE_OTHER): Payer: 59 | Admitting: Gynecology

## 2016-06-30 ENCOUNTER — Encounter: Payer: Self-pay | Admitting: Gynecology

## 2016-06-30 VITALS — BP 120/76 | Ht 69.0 in | Wt 139.0 lb

## 2016-06-30 DIAGNOSIS — Z01419 Encounter for gynecological examination (general) (routine) without abnormal findings: Secondary | ICD-10-CM | POA: Diagnosis not present

## 2016-06-30 DIAGNOSIS — N952 Postmenopausal atrophic vaginitis: Secondary | ICD-10-CM | POA: Diagnosis not present

## 2016-06-30 DIAGNOSIS — Z23 Encounter for immunization: Secondary | ICD-10-CM | POA: Diagnosis not present

## 2016-06-30 NOTE — Progress Notes (Signed)
    Vanessa Larsen 12-27-1945 [id number]        69 y.o.  G3P3003  for annual exam.  Doing well without complaints.  Past medical history,surgical history, problem list, medications, allergies, family history and social history were all reviewed and documented as reviewed in the EPIC chart.  ROS:  Performed with pertinent positives and negatives included in the history, assessment and plan.   Additional significant findings :  None   Exam: [name] assistant Vitals:   06/30/16 1002  BP: 120/76  Weight: 139 lb (63 kg)  Height: 5\' 9"  (1.753 m)   Body mass index is 20.53 kg/m.  General appearance:  Normal affect, orientation and appearance. Skin: Grossly normal HEENT: Without gross lesions.  No cervical or supraclavicular adenopathy. Thyroid normal.  Lungs:  Clear without wheezing, rales or rhonchi Cardiac: RR, without RMG Abdominal:  Soft, nontender, without masses, guarding, rebound, organomegaly or hernia Breasts:  Examined lying and sitting without masses, retractions, discharge or axillary adenopathy. Pelvic:  Ext, BUS, Vagina with atrophic changes  Cervix with atrophic changes  Uterus anteverted, normal size, shape and contour, midline and mobile nontender   Adnexa without masses or tenderness    Anus and perineum normal   Rectovaginal normal sphincter tone without palpated masses or tenderness.    Assessment/Plan:  70 y.o. G3P3003 female for annual exam.   1. Postmenopausal/atrophic genital changes. Without significant hot flushes, night sweats, vaginal dryness or any vaginal bleeding. Continue monitor report any issues or bleeding. 2. Pap smear 2015. No Pap smear done today. History of cryosurgery a number of years ago with reactive atypia to endometriosis of the cervix but no true dysplasia. Options to stop screening altogether or less frequent screening intervals per current screening guidelines based on age discussed. Will readdress on an annual  basis. 3. Mammography 12/2015. Continue with annual mammography when due. SBE monthly reviewed. 4. DEXA 2015 normal. Plan repeat at 5 year interval. Increased calcium vitamin D. 5. Colonoscopy 2012. Repeat at their recommended interval. 6. Health maintenance. No routine lab work done as patient does this elsewhere. Follow up 1 year, sooner as needed.   Anastasio Auerbach MD, 10:32 AM 06/30/2016

## 2016-06-30 NOTE — Addendum Note (Signed)
Addended by: Nelva Nay on: 06/30/2016 12:20 PM   Modules accepted: Orders

## 2016-06-30 NOTE — Patient Instructions (Signed)

## 2016-12-06 ENCOUNTER — Other Ambulatory Visit: Payer: Self-pay | Admitting: Internal Medicine

## 2016-12-06 DIAGNOSIS — Z1231 Encounter for screening mammogram for malignant neoplasm of breast: Secondary | ICD-10-CM

## 2016-12-28 ENCOUNTER — Ambulatory Visit
Admission: RE | Admit: 2016-12-28 | Discharge: 2016-12-28 | Disposition: A | Discharge status: Home / Self Care | Payer: Medicare Other | Source: Ambulatory Visit | Attending: Internal Medicine | Admitting: Internal Medicine

## 2016-12-28 DIAGNOSIS — Z1231 Encounter for screening mammogram for malignant neoplasm of breast: Secondary | ICD-10-CM

## 2017-03-08 ENCOUNTER — Encounter: Payer: Self-pay | Admitting: Gastroenterology

## 2017-04-14 ENCOUNTER — Other Ambulatory Visit: Payer: Self-pay | Admitting: *Deleted

## 2017-04-14 ENCOUNTER — Encounter: Payer: Self-pay | Admitting: *Deleted

## 2017-04-21 ENCOUNTER — Encounter (INDEPENDENT_AMBULATORY_CARE_PROVIDER_SITE_OTHER): Payer: Self-pay

## 2017-04-21 ENCOUNTER — Encounter: Payer: Self-pay | Admitting: Gastroenterology

## 2017-04-21 ENCOUNTER — Ambulatory Visit (INDEPENDENT_AMBULATORY_CARE_PROVIDER_SITE_OTHER): Payer: 59 | Admitting: Gastroenterology

## 2017-04-21 ENCOUNTER — Other Ambulatory Visit (INDEPENDENT_AMBULATORY_CARE_PROVIDER_SITE_OTHER): Payer: 59

## 2017-04-21 VITALS — BP 112/78 | HR 76 | Ht 68.5 in | Wt 141.1 lb

## 2017-04-21 DIAGNOSIS — R197 Diarrhea, unspecified: Secondary | ICD-10-CM

## 2017-04-21 DIAGNOSIS — E079 Disorder of thyroid, unspecified: Secondary | ICD-10-CM | POA: Insufficient documentation

## 2017-04-21 DIAGNOSIS — R195 Other fecal abnormalities: Secondary | ICD-10-CM

## 2017-04-21 LAB — HIGH SENSITIVITY CRP: CRP, High Sensitivity: 2.89 mg/L (ref 0.000–5.000)

## 2017-04-21 LAB — IGA: IGA: 165 mg/dL (ref 68–378)

## 2017-04-21 LAB — SEDIMENTATION RATE: Sed Rate: 10 mm/hr (ref 0–30)

## 2017-04-21 MED ORDER — NA SULFATE-K SULFATE-MG SULF 17.5-3.13-1.6 GM/177ML PO SOLN: 1.0000 | Freq: Once | ORAL | 0 refills | Status: AC

## 2017-04-21 MED ORDER — CHOLESTYRAMINE 4 G PO PACK: 4.0000 g | PACK | Freq: Every day | ORAL | 0 refills | Status: DC

## 2017-04-21 NOTE — Patient Instructions (Addendum)
You have been scheduled for an endoscopy and colonoscopy. Please follow the written instructions given to you at your visit today. Please pick up your prep supplies at the pharmacy within the next 1-3 days. If you use inhalers (even only as needed), please bring them with you on the day of your procedure.   Go to the basement for labs today  We will send Cholestyramine to your pharmacy  Use 1/2 packet at bedtime for 1 week if no better increase to 1 packet for 1 week  We will give you samples of Creon to take for 2 days  2 capsules with meals and 1 at bedtime   AVOID NASIDS

## 2017-04-21 NOTE — Progress Notes (Signed)
[facility]    [id number]    11-01-1945  Primary Care Physician:Shaw, Gwyndolyn Saxon, MD  Referring Physician: Marton Redwood, MD [address] [place], [place] [address]  Chief complaint:  Diarrhea, heme positive stool  HPI:  71 year old female previously followed by Dr. Sharlett Iles and Dr. Olevia Perches is here to reestablish care. She was last seen in office at [facility] in June 2012. Patient had positive fecal Hemoccult. She has had positive fecal Hemoccult previously on multiple other occasions per patient. Last EGD and colonoscopy in 2012 with removal of diminutive colon polyp that was hyperplastic. Biopsies from EG junction showed mild reflux-related inflammation, no Barrett's and duodenal biopsies showed mild Brunner's gland hyperplasia otherwise were unremarkable. Patient also has chronic diarrhea with loose semi-formed bowel movements 1-2 daily and she has exacerbation with 3-4 bowel movements a day about once or twice a month. She does not think she is lactose intolerant. Diarrhea actually improves when she travels. She also had some nocturnal episodes of diarrhea with urgency. She was taking nsaids ibuprofen and Aleve frequently but discontinued after fecal Hemoccult was positive Denies any nausea, vomiting, dysphagia, odynophagia, abdominal pain, melena or bright red blood per rectum. No family history of colon cancer . Father had pancreatic cancer.   Outpatient Encounter Prescriptions as of 04/21/2017  Medication Sig  . atorvastatin (LIPITOR) 10 MG tablet Take 10 mg by mouth daily.  . Calcium Carb-Cholecalciferol (CALCIUM 1000 + D PO) Take 1 capsule by mouth daily.    Marland Kitchen levothyroxine (SYNTHROID, LEVOTHROID) 88 MCG tablet Take 100 mcg by mouth daily.   Marland Kitchen loperamide (IMODIUM) 2 MG capsule Take 2 mg by mouth as needed for diarrhea or loose stools.  . Multiple Vitamins-Minerals (MULTIVITAMIN WITH MINERALS) tablet Take 1 tablet by mouth daily.    . naproxen sodium (ALEVE) 220  MG tablet Take 220 mg by mouth as needed.  . [DISCONTINUED] ibuprofen (ADVIL,MOTRIN) 200 MG tablet Take 200 mg by mouth every 6 (six) hours as needed.     Facility-Administered Encounter Medications as of 04/21/2017  Medication  . 0.9 %  sodium chloride infusion    Allergies as of 04/21/2017 - Review Complete 04/21/2017  Allergen Reaction Noted  . Fentanyl Nausea And Vomiting 06/30/2016    Past Medical History:  Diagnosis Date  . Anal fissure   . Arthritis   . Elevated cholesterol   . Hyperplastic colon polyp   . Hypothyroidism   . IBS (irritable bowel syndrome)   . Osteoarthritis     Past Surgical History:  Procedure Laterality Date  . BREAST BIOPSY Left    neg  . CATARACT EXTRACTION, BILATERAL    . COLPOSCOPY    . EYE SURGERY     cosmetic  . GYNECOLOGIC CRYOSURGERY     age 69    Family History  Problem Relation Age of Onset  . Pancreatic cancer Father   . Heart disease Father   . Esophageal cancer Mother   . Diabetes Cousin   . Heart disease Paternal Grandmother   . Peptic Ulcer Sister   . Gallstones Sister   . Colon cancer Neg Hx     Social History   Social History  . Marital status: Married    Spouse name: N/A  . Number of children: 3  . Years of education: N/A   Occupational History  . homemaker    Social History Main Topics  . Smoking status: Former Audiological scientist  date: 10/04/1971  . Smokeless tobacco: Never Used  . Alcohol use 8.4 oz/week    14 Standard drinks or equivalent per week     Comment: 2 drinks per day  . Drug use: No  . Sexual activity: Yes    Birth control/ protection: Post-menopausal     Comment: 1st intercourse 71 yo-Fewer than 5 partners   Other Topics Concern  . Not on file   Social History Narrative  . No narrative on file      Review of systems: Review of Systems  Constitutional: Negative for fever and chills.  HENT: Negative.   Eyes: Negative for blurred vision.  Respiratory: Negative for cough, shortness of  breath and wheezing.   Cardiovascular: Negative for chest pain and palpitations.  Gastrointestinal: as per HPI Genitourinary: Negative for dysuria, urgency, frequency and hematuria.  Musculoskeletal: Negative for myalgias, back pain and joint pain.  Skin: Negative for itching and rash.  Neurological: Negative for dizziness, tremors, focal weakness, seizures and loss of consciousness.  Endo/Heme/Allergies: Positive for seasonal allergies.  Psychiatric/Behavioral: Negative for depression, suicidal ideas and hallucinations.  All other systems reviewed and are negative.   Physical Exam: Vitals:   04/21/17 1040  BP: 112/78  Pulse: 76   Body mass index is 21.15 kg/m. Gen:      No acute distress HEENT:  EOMI, sclera anicteric Neck:     No masses; no thyromegaly Lungs:    Clear to auscultation bilaterally; normal respiratory effort CV:         Regular rate and rhythm; no murmurs Abd:      + bowel sounds; soft, non-tender; no palpable masses, no distension Ext:    No edema; adequate peripheral perfusion Skin:      Warm and dry; no rash Neuro: alert and oriented x 3 Psych: normal mood and affect  Data Reviewed:  Reviewed labs, radiology imaging, old records and pertinent past GI work up   Assessment and Plan/Recommendations:  71 year old female here for evaluation with heme positive stool and chronic diarrhea  Last EGD and colonoscopy more than 6 years ago We will schedule for both EGD and colonoscopy to evaluate for possible peptic ulcer disease, gastritis, celiac disease or neoplastic lesion. The risks and benefits as well as alternatives of endoscopic procedure(s) have been discussed and reviewed. All questions answered. The patient agrees to proceed. If EGD and colonoscopy unremarkable have to consider small bowel review capsule to further evaluate heme-positive stool  Chronic diarrhea: Will also obtained: Biopsies to exclude microscopic colitis Advised patient to continue to  hold nsaids Avoid excessive fiber and okay to use Imodium as needed  We'll do a trial of cholestyramine half packet at bedtime for a week and if no improvement can increase the dose to one packet daily at bedtime to see if she has possible bile salt induced diarrhea  Check fecal fat and elastase to rule out pancreatic insufficiency. We will also consider a trial of Creon 36,000 units 2 capsules by mouth with meals and one capsule with snack for 2 days  Follow-up TTG, IgA and CRP  Return after procedure as needed    Raliegh Ip Denzil Magnuson , MD [phone] Mon-Fri 8a-5p [phone] after 5p, weekends, holidays  CC: Marton Redwood, MD

## 2017-04-24 LAB — TISSUE TRANSGLUTAMINASE, IGA: Tissue Transglutaminase Ab, IgA: 1 U/mL (ref <4)

## 2017-05-18 ENCOUNTER — Telehealth: Payer: Self-pay | Admitting: Gastroenterology

## 2017-05-18 NOTE — Telephone Encounter (Signed)
Left message to call.

## 2017-05-19 ENCOUNTER — Other Ambulatory Visit: Payer: Self-pay

## 2017-05-19 MED ORDER — DIPHENOXYLATE-ATROPINE 2.5-0.025 MG PO TABS: 1.0000 | ORAL_TABLET | Freq: Three times a day (TID) | ORAL | 0 refills | Status: DC | PRN

## 2017-05-19 NOTE — Telephone Encounter (Signed)
Patient is agreeable to this plan. Opening for double procedure (colon and EGD) on 06/02/17. Patient accepts the appointment, but will have to confirm this on her own schedule. She will call back asap.  Lomotil rx faxed to [facility].

## 2017-05-19 NOTE — Telephone Encounter (Signed)
The patient reports she had worsened diarrhea with the medications Creon and Cholestyramine. She tried each one separately. Her diarrhea is 3 to 5 liquid stools a day, worse at night and at times "uncontrollable" and causing "accidents." She can "get control after a couple of days of Imodium" but the diarrhea returns if she stops Imodium. She has not turned in the specimen for fecal fat.

## 2017-05-19 NOTE — Telephone Encounter (Signed)
Please check if we can bring her in sooner for colonoscopy. Ok to schedule egd and colonoscopy separately if needed and patient is agreeable. Start Lomotil 1 capsule three times daily. Stop creon and cholestyramine. Ok to use imodium as needed

## 2017-05-22 ENCOUNTER — Other Ambulatory Visit: Payer: Self-pay | Admitting: Gastroenterology

## 2017-05-22 ENCOUNTER — Other Ambulatory Visit: Payer: 59

## 2017-05-22 NOTE — Telephone Encounter (Signed)
Patient calling to confirm appt on 06/02/17 and wanting to cancel the other set date. Pt also wants to know if she now needs to move follow up appt to different date than 9.25.18.

## 2017-05-22 NOTE — Telephone Encounter (Signed)
Spoke with the patient. There are not any earlier openings to move her return office visit to. If the findings of the colonoscopy and EGD show anything that requires an earlier return appointment, Dr Silverio Decamp will work her in or send her to the APP.

## 2017-05-26 LAB — FECAL FAT, QUALITATIVE: Fecal Fat Qualitative: NORMAL

## 2017-05-29 ENCOUNTER — Telehealth: Payer: Self-pay | Admitting: Gastroenterology

## 2017-05-29 LAB — PANCREATIC ELASTASE, FECAL: Pancreatic Elastase-1, Stool: 478 mcg/g

## 2017-05-29 NOTE — Telephone Encounter (Signed)
Patient is aware of the results.

## 2017-06-02 ENCOUNTER — Ambulatory Visit (AMBULATORY_SURGERY_CENTER): Payer: 59 | Admitting: Gastroenterology

## 2017-06-02 ENCOUNTER — Encounter: Payer: Self-pay | Admitting: Gastroenterology

## 2017-06-02 VITALS — BP 116/67 | HR 63 | Temp 98.9°F | Resp 16 | Ht 68.0 in | Wt 141.0 lb

## 2017-06-02 DIAGNOSIS — K2900 Acute gastritis without bleeding: Secondary | ICD-10-CM | POA: Diagnosis not present

## 2017-06-02 DIAGNOSIS — R195 Other fecal abnormalities: Secondary | ICD-10-CM

## 2017-06-02 DIAGNOSIS — R197 Diarrhea, unspecified: Secondary | ICD-10-CM

## 2017-06-02 MED ORDER — SODIUM CHLORIDE 0.9 % IV SOLN: 500.0000 mL | INTRAVENOUS | Status: DC

## 2017-06-02 NOTE — Patient Instructions (Signed)
Handouts given:Gastritis, Esophagitis and Hemorrhoids.  YOU HAD AN ENDOSCOPIC PROCEDURE TODAY AT [facility]:   Refer to the procedure report that was given to you for any specific questions about what was found during the examination.  If the procedure report does not answer your questions, please call your gastroenterologist to clarify.  If you requested that your care partner not be given the details of your procedure findings, then the procedure report has been included in a sealed envelope for you to review at your convenience later.  YOU SHOULD EXPECT: Some feelings of bloating in the abdomen. Passage of more gas than usual.  Walking can help get rid of the air that was put into your GI tract during the procedure and reduce the bloating. If you had a lower endoscopy (such as a colonoscopy or flexible sigmoidoscopy) you may notice spotting of blood in your stool or on the toilet paper. If you underwent a bowel prep for your procedure, you may not have a normal bowel movement for a few days.  Please Note:  You might notice some irritation and congestion in your nose or some drainage.  This is from the oxygen used during your procedure.  There is no need for concern and it should clear up in a day or so.  SYMPTOMS TO REPORT IMMEDIATELY:   Following lower endoscopy (colonoscopy or flexible sigmoidoscopy):  Excessive amounts of blood in the stool  Significant tenderness or worsening of abdominal pains  Swelling of the abdomen that is new, acute  Fever of 100F or higher   Following upper endoscopy (EGD)  Vomiting of blood or coffee ground material  New chest pain or pain under the shoulder blades  Painful or persistently difficult swallowing  New shortness of breath  Fever of 100F or higher  Black, tarry-looking stools  For urgent or emergent issues, a gastroenterologist can be reached at any hour by calling [phone].   DIET:  We do recommend a small meal at  first, but then you may proceed to your regular diet.  Drink plenty of fluids but you should avoid alcoholic beverages for 24 hours.  ACTIVITY:  You should plan to take it easy for the rest of today and you should NOT DRIVE or use heavy machinery until tomorrow (because of the sedation medicines used during the test).    FOLLOW UP: Our staff will call the number listed on your records the next business day following your procedure to check on you and address any questions or concerns that you may have regarding the information given to you following your procedure. If we do not reach you, we will leave a message.  However, if you are feeling well and you are not experiencing any problems, there is no need to return our call.  We will assume that you have returned to your regular daily activities without incident.  If any biopsies were taken you will be contacted by phone or by letter within the next 1-3 weeks.  Please call us at [phone] if you have not heard about the biopsies in 3 weeks.    SIGNATURES/CONFIDENTIALITY: You and/or your care partner have signed paperwork which will be entered into your electronic medical record.  These signatures attest to the fact that that the information above on your After Visit Summary has been reviewed and is understood.  Full responsibility of the confidentiality of this discharge information lies with you and/or your care-partner.

## 2017-06-02 NOTE — Progress Notes (Signed)
Report to PACU, RN, vss, BBS= Clear.  

## 2017-06-02 NOTE — Progress Notes (Signed)
Called to room to assist during endoscopic procedure.  Patient ID and intended procedure confirmed with present staff. Received instructions for my participation in the procedure from the performing physician.  

## 2017-06-02 NOTE — Progress Notes (Signed)
Pt's states no medical or surgical changes since previsit or office visit. 

## 2017-06-02 NOTE — Op Note (Signed)
[facility] Patient Name: Vanessa Larsen Procedure Date: 06/02/2017 1:43 PM MRN: [id number] Endoscopist: Mauri Pole , MD Age: 71 Referring MD:  Date of Birth: 1946/02/01 Gender: Female Account #: [id number] Procedure:                Upper GI endoscopy Indications:              Gastrointestinal bleeding of unknown origin Medicines:                Monitored Anesthesia Care Procedure:                Pre-Anesthesia Assessment:                           - Prior to the procedure, a History and Physical                            was performed, and patient medications and                            allergies were reviewed. The patient's tolerance of                            previous anesthesia was also reviewed. The risks                            and benefits of the procedure and the sedation                            options and risks were discussed with the patient.                            All questions were answered, and informed consent                            was obtained. Prior Anticoagulants: The patient has                            taken no previous anticoagulant or antiplatelet                            agents. ASA Grade Assessment: II - A patient with                            mild systemic disease. After reviewing the risks                            and benefits, the patient was deemed in                            satisfactory condition to undergo the procedure.                           After obtaining informed consent, the endoscope was  passed under direct vision. Throughout the                            procedure, the patient's blood pressure, pulse, and                            oxygen saturations were monitored continuously. The                            Model GIF-HQ190 [id number]) scope was introduced                            through the mouth, and advanced to the second part                            of  duodenum. The upper GI endoscopy was                            accomplished without difficulty. The patient                            tolerated the procedure well. Scope In: Scope Out: Findings:                 LA Grade A (one or more mucosal breaks less than 5                            mm, not extending between tops of 2 mucosal folds)                            esophagitis with no bleeding was found 37 to 39 cm                            from the incisors.                           Scattered mild inflammation characterized by                            congestion (edema), erythema and mucus was found in                            the entire examined stomach. Biopsies were taken                            with a cold forceps for Helicobacter pylori testing.                           A single localized erosion without bleeding was                            found in the duodenal bulb.  The second portion of the duodenum was normal. Complications:            No immediate complications. Estimated Blood Loss:     Estimated blood loss was minimal. Impression:               - LA Grade A esophagitis.                           - Gastritis. Biopsied.                           - Duodenal erosion without bleeding.                           - Normal second portion of the duodenum. Recommendation:           - Patient has a contact number available for                            emergencies. The signs and symptoms of potential                            delayed complications were discussed with the                            patient. Return to normal activities tomorrow.                            Written discharge instructions were provided to the                            patient.                           - Resume previous diet.                           - Continue present medications.                           - Await pathology results. Mauri Pole,  MD 06/02/2017 2:20:24 PM This report has been signed electronically.

## 2017-06-02 NOTE — Op Note (Signed)
[facility] Patient Name: Vanessa Larsen Procedure Date: 06/02/2017 1:43 PM MRN: [id number] Endoscopist: Mauri Pole , MD Age: 71 Referring MD:  Date of Birth: [birth year] Gender: Female Account #: [id number] Procedure:                Colonoscopy Indications:              Evaluation of unexplained GI bleeding, Clinically                            significant diarrhea of unexplained origin Medicines:                Monitored Anesthesia Care Procedure:                Pre-Anesthesia Assessment:                           - Prior to the procedure, a History and Physical                            was performed, and patient medications and                            allergies were reviewed. The patient's tolerance of                            previous anesthesia was also reviewed. The risks                            and benefits of the procedure and the sedation                            options and risks were discussed with the patient.                            All questions were answered, and informed consent                            was obtained. Prior Anticoagulants: The patient has                            taken no previous anticoagulant or antiplatelet                            agents. ASA Grade Assessment: II - A patient with                            mild systemic disease. After reviewing the risks                            and benefits, the patient was deemed in                            satisfactory condition to undergo the procedure.  After obtaining informed consent, the colonoscope                            was passed under direct vision. Throughout the                            procedure, the patient's blood pressure, pulse, and                            oxygen saturations were monitored continuously. The                            Model CF-HQ190L [id number]) scope was introduced                            through the  anus and advanced to the the terminal                            ileum, with identification of the appendiceal                            orifice and IC valve. The colonoscopy was performed                            without difficulty. The patient tolerated the                            procedure well. The quality of the bowel                            preparation was excellent. The terminal ileum,                            ileocecal valve, appendiceal orifice, and rectum                            were photographed. Scope In: 1:59:39 PM Scope Out: 2:12:57 PM Scope Withdrawal Time: 0 hours 9 minutes 16 seconds  Total Procedure Duration: 0 hours 13 minutes 18 seconds  Findings:                 The perianal and digital rectal examinations were                            normal.                           The terminal ileum appeared normal.                           The colon (entire examined portion) appeared                            normal. Biopsies for histology were taken with a  cold forceps from the right colon and left colon                            for evaluation of microscopic colitis.                           Non-bleeding internal hemorrhoids were found during                            retroflexion. The hemorrhoids were small. Complications:            No immediate complications. Estimated Blood Loss:     Estimated blood loss was minimal. Impression:               - The examined portion of the ileum was normal.                           - The entire examined colon is normal. Biopsied.                           - Non-bleeding internal hemorrhoids. Recommendation:           - Patient has a contact number available for                            emergencies. The signs and symptoms of potential                            delayed complications were discussed with the                            patient. Return to normal activities tomorrow.                             Written discharge instructions were provided to the                            patient.                           - Resume previous diet.                           - Continue present medications.                           - Await pathology results.                           - Repeat colonoscopy date to be determined after                            pending pathology results are reviewed for                            surveillance based on pathology results.                           -  Return to GI clinic PRN. Mauri Pole, MD 06/02/2017 2:23:19 PM This report has been signed electronically.

## 2017-06-06 ENCOUNTER — Telehealth: Payer: Self-pay | Admitting: *Deleted

## 2017-06-06 NOTE — Telephone Encounter (Signed)
  Follow up Call-  Call back number 06/02/2017  Post procedure Call Back phone  # [phone]  Permission to leave phone message Yes  Some recent data might be hidden     Patient questions:  Do you have a fever, pain , or abdominal swelling? No. Pain Score  0 *  Have you tolerated food without any problems? Yes.    Have you been able to return to your normal activities? Yes.    Do you have any questions about your discharge instructions: Diet   No. Medications  No. Follow up visit  No.  Do you have questions or concerns about your Care? No.  Actions: * If pain score is 4 or above: No action needed, pain <4.

## 2017-06-09 ENCOUNTER — Other Ambulatory Visit: Payer: Self-pay

## 2017-06-09 MED ORDER — BUDESONIDE ER 9 MG PO TB24: 9.0000 mg | ORAL_TABLET | Freq: Every day | ORAL | 1 refills | Status: DC

## 2017-06-21 ENCOUNTER — Encounter: Payer: 59 | Admitting: Gastroenterology

## 2017-06-27 ENCOUNTER — Ambulatory Visit (INDEPENDENT_AMBULATORY_CARE_PROVIDER_SITE_OTHER): Payer: 59 | Admitting: Gastroenterology

## 2017-06-27 ENCOUNTER — Encounter: Payer: Self-pay | Admitting: Gastroenterology

## 2017-06-27 VITALS — BP 118/72 | HR 76 | Ht 69.0 in | Wt 139.4 lb

## 2017-06-27 DIAGNOSIS — K52832 Lymphocytic colitis: Secondary | ICD-10-CM | POA: Diagnosis not present

## 2017-06-27 DIAGNOSIS — R195 Other fecal abnormalities: Secondary | ICD-10-CM | POA: Diagnosis not present

## 2017-06-27 DIAGNOSIS — K219 Gastro-esophageal reflux disease without esophagitis: Secondary | ICD-10-CM | POA: Diagnosis not present

## 2017-06-27 MED ORDER — RANITIDINE HCL 150 MG PO TABS: 150.0000 mg | ORAL_TABLET | Freq: Every day | ORAL | 3 refills | Status: DC | PRN

## 2017-06-27 NOTE — Progress Notes (Signed)
Vanessa Larsen    [id number]    November 15, 1945  Primary Care Physician:Shaw, Gwyndolyn Saxon, MD  Referring Physician: Marton Redwood, MD [address] [place], [place] [address]  Chief complaint:  Diarrhea  HPI:  71 year old female here for follow-up visit for chronic diarrhea. Status post EGD and colonoscopy. Colon Biopsies positive for lymphocytic colitis. Patient's having decreased bowel frequency but she continues to have semi-formed to loose bowel movement 1 or 2 times a day. She had mild erosive esophagitis and erosive gastritis. She has had heme positive stool on multiple occasions in the past. She is currently on budesonide. She is no longer taking ibuprofen or Aleve Denies any nausea, vomiting, abdominal pain, melena or bright red blood per rectum    Outpatient Encounter Prescriptions as of 06/27/2017  Medication Sig  . atorvastatin (LIPITOR) 10 MG tablet Take 10 mg by mouth daily.  . Budesonide ER (UCERIS) 9 MG TB24 Take 9 mg by mouth daily.  . Calcium Carb-Cholecalciferol (CALCIUM 1000 + D PO) Take 1 capsule by mouth daily.    . diphenoxylate-atropine (LOMOTIL) 2.5-0.025 MG tablet Take 1 tablet by mouth 3 (three) times daily as needed for diarrhea or loose stools.  Marland Kitchen levothyroxine (SYNTHROID, LEVOTHROID) 88 MCG tablet Take 100 mcg by mouth daily.   Marland Kitchen loperamide (IMODIUM) 2 MG capsule Take 2 mg by mouth as needed for diarrhea or loose stools.  . Multiple Vitamins-Minerals (MULTIVITAMIN WITH MINERALS) tablet Take 1 tablet by mouth daily.    . naproxen sodium (ALEVE) 220 MG tablet Take 220 mg by mouth as needed.   Facility-Administered Encounter Medications as of 06/27/2017  Medication  . 0.9 %  sodium chloride infusion  . 0.9 %  sodium chloride infusion    Allergies as of 06/27/2017 - Review Complete 06/27/2017  Allergen Reaction Noted  . Fentanyl Nausea And Vomiting 06/30/2016    Past Medical History:  Diagnosis Date  . Anal fissure   . Arthritis   . Elevated  cholesterol   . Hyperplastic colon polyp   . Hypothyroidism   . IBS (irritable bowel syndrome)   . Osteoarthritis     Past Surgical History:  Procedure Laterality Date  . BREAST BIOPSY Left    neg  . CATARACT EXTRACTION, BILATERAL    . COLPOSCOPY    . EYE SURGERY     cosmetic  . GYNECOLOGIC CRYOSURGERY     age 28    Family History  Problem Relation Age of Onset  . Pancreatic cancer Father   . Heart disease Father   . Esophageal cancer Mother   . Diabetes Cousin   . Heart disease Paternal Grandmother   . Peptic Ulcer Sister   . Gallstones Sister   . Colon cancer Neg Hx     Social History   Social History  . Marital status: Married    Spouse name: N/A  . Number of children: 3  . Years of education: N/A   Occupational History  . homemaker    Social History Main Topics  . Smoking status: Former Smoker    Quit date: 10/04/1971  . Smokeless tobacco: Never Used  . Alcohol use 8.4 oz/week    14 Standard drinks or equivalent per week     Comment: 2 drinks per day  . Drug use: No  . Sexual activity: Yes    Birth control/ protection: Post-menopausal     Comment: 1st intercourse 71 yo-Fewer than 5 partners   Other  Topics Concern  . Not on file   Social History Narrative  . No narrative on file      Review of systems: Review of Systems  Constitutional: Negative for fever and chills.  HENT: Negative.   Eyes: Negative for blurred vision.  Respiratory: Negative for cough, shortness of breath and wheezing.   Cardiovascular: Negative for chest pain and palpitations.  Gastrointestinal: as per HPI Genitourinary: Negative for dysuria, urgency, frequency and hematuria.  Musculoskeletal: Negative for myalgias, back pain and joint pain.  Skin: Negative for itching and rash.  Neurological: Negative for dizziness, tremors, focal weakness, seizures and loss of consciousness.  Endo/Heme/Allergies: Positive for seasonal allergies.  Psychiatric/Behavioral: Negative for  depression, suicidal ideas and hallucinations.  All other systems reviewed and are negative.   Physical Exam: Vitals:   06/27/17 1415  BP: 118/72  Pulse: 76   Body mass index is 20.58 kg/m. Gen:      No acute distress HEENT:  EOMI, sclera anicteric Neck:     No masses; no thyromegaly Lungs:    Clear to auscultation bilaterally; normal respiratory effort CV:         Regular rate and rhythm; no murmurs Abd:      + bowel sounds; soft, non-tender; no palpable masses, no distension Ext:    No edema; adequate peripheral perfusion Skin:      Warm and dry; no rash Neuro: alert and oriented x 3 Psych: normal mood and affect  Data Reviewed:  Reviewed labs, radiology imaging, old records and pertinent past GI work up   Assessment and Plan/Recommendations:  71 year old female with heme-positive stool status post EGD and colonoscopy, chronic diarrhea with biopsies suggestive of microscopic colitis (lymphocytic colitis)  Lymphocytic colitis: Continue budesonide, Uceris 9 milligrams daily for total 2 month duration Continue to avoid NSAIDs Start probiotic  GERD: Antireflux measures Zantac 150 mg as needed  Heme-positive stool: Schedule for small bowel video capsule to further evaluate and exclude any small bowel lesions  25 minutes was spent face-to-face with the patient. Greater than 50% of the time used for counseling as well as treatment plan and follow-up. She had multiple questions which were answered to her satisfaction  K. Denzil Magnuson , MD [phone] Mon-Fri 8a-5p [phone] after 5p, weekends, holidays  CC: Marton Redwood, MD

## 2017-06-27 NOTE — Patient Instructions (Addendum)
You have been scheduled for a capsule endoscopy, instructions have been given  Use Probiotic VSL # 3 112 B units twice a day for 2 weeks  Continue Uceris for a total of 60 days  Avoid NSAIDS  We have sent in Zantac to your pharmacy  Follow up in 6 months

## 2017-07-24 ENCOUNTER — Encounter: Payer: Self-pay | Admitting: Gynecology

## 2017-07-24 ENCOUNTER — Ambulatory Visit (INDEPENDENT_AMBULATORY_CARE_PROVIDER_SITE_OTHER): Payer: 59 | Admitting: Gynecology

## 2017-07-24 VITALS — BP 116/74 | Ht 69.0 in | Wt 141.0 lb

## 2017-07-24 DIAGNOSIS — N952 Postmenopausal atrophic vaginitis: Secondary | ICD-10-CM

## 2017-07-24 DIAGNOSIS — Z124 Encounter for screening for malignant neoplasm of cervix: Secondary | ICD-10-CM | POA: Diagnosis not present

## 2017-07-24 DIAGNOSIS — Z23 Encounter for immunization: Secondary | ICD-10-CM

## 2017-07-24 DIAGNOSIS — Z01411 Encounter for gynecological examination (general) (routine) with abnormal findings: Secondary | ICD-10-CM | POA: Diagnosis not present

## 2017-07-24 NOTE — Addendum Note (Signed)
Addended by: Nelva Nay on: 07/24/2017 05:00 PM   Modules accepted: Orders

## 2017-07-24 NOTE — Patient Instructions (Addendum)
Annual exam in 1 year, sooner if any issues.

## 2017-07-24 NOTE — Progress Notes (Signed)
    Vanessa Larsen Feb 03, 1946 [id number]        71 y.o.  G3P3003 for annual gynecologic exam.    Past medical history,surgical history, problem list, medications, allergies, family history and social history were all reviewed and documented as reviewed in the EPIC chart.  ROS:  Performed with pertinent positives and negatives included in the history, assessment and plan.   Additional significant findings : None   Exam: [name] assistant Vitals:   07/24/17 1606  BP: 116/74  Weight: 141 lb (64 kg)  Height: 5\' 9"  (1.753 m)   Body mass index is 20.82 kg/m.  General appearance:  Normal affect, orientation and appearance. Skin: Grossly normal HEENT: Without gross lesions.  No cervical or supraclavicular adenopathy. Thyroid normal.  Lungs:  Clear without wheezing, rales or rhonchi Cardiac: RR, without RMG Abdominal:  Soft, nontender, without masses, guarding, rebound, organomegaly or hernia Breasts:  Examined lying and sitting without masses, retractions, discharge or axillary adenopathy. Pelvic:  Ext, BUS, Vagina: With atrophic changes  Cervix: With atrophic changes  Uterus: Anteverted, normal size, shape and contour, midline and mobile nontender   Adnexa: Without masses or tenderness    Anus and perineum: Normal   Rectovaginal: Normal sphincter tone without palpated masses or tenderness.    Assessment/Plan:  71 y.o. G63P3003 female for annual gynecologic exam.   1. Postmenopausal/atrophic genital changes.  No significant hot flushes, night sweats, vaginal dryness or any vaginal bleeding.  Continue to monitor and report any issues or bleeding. 2. Pap smear 2015.  Pap smear done today.  History of cryosurgery a number of years ago with reactive atypia secondary to endometriosis of the cervix.  No true dysplasia.  Options to stop screening per current screening guidelines based on age reviewed.  Will readdress on an annual basis. 3. Mammography 12/2016.  Continue with annual  mammography when due.  SBE monthly reviewed.  Breast exam normal today.. 4. Colonoscopy 2018.  Repeat at their recommended interval. 5. DEXA 2015 normal.  Plan repeat DEXA at 5-year interval.  Increase calcium and vitamin D. 6. Health maintenance.  No routine lab work done as patient does use elsewhere.  Follow-up 1 year, sooner as needed.   Anastasio Auerbach MD, 4:55 PM 07/24/2017

## 2017-07-26 ENCOUNTER — Ambulatory Visit (INDEPENDENT_AMBULATORY_CARE_PROVIDER_SITE_OTHER): Payer: 59 | Admitting: Gastroenterology

## 2017-07-26 ENCOUNTER — Encounter: Payer: Self-pay | Admitting: Gastroenterology

## 2017-07-26 DIAGNOSIS — D509 Iron deficiency anemia, unspecified: Secondary | ICD-10-CM | POA: Diagnosis not present

## 2017-07-26 DIAGNOSIS — R195 Other fecal abnormalities: Secondary | ICD-10-CM | POA: Diagnosis not present

## 2017-07-26 LAB — PAP IG W/ RFLX HPV ASCU

## 2017-07-26 NOTE — Progress Notes (Signed)
Patient here for capsule endoscopy. Tolerated procedure. Verbalizes understanding of written and verbal instructions. Capsule ID [id number] exp 08/08/18

## 2017-08-02 ENCOUNTER — Telehealth: Payer: Self-pay

## 2017-08-02 ENCOUNTER — Other Ambulatory Visit: Payer: Self-pay

## 2017-08-02 DIAGNOSIS — Z189 Retained foreign body fragments, unspecified material: Secondary | ICD-10-CM

## 2017-08-02 NOTE — Telephone Encounter (Signed)
Patient contacted to advise her of the normal study and no findings to explain heme positive stool. She has not seen the capsule in her stool. Requests an abdominal xray. She is going to travel out of the [place]. Wants to be certain about this.

## 2017-08-02 NOTE — Telephone Encounter (Signed)
It was a complete study, less likely to be retained. Ok to get abdominal X- ray to reassure patient.

## 2017-08-04 ENCOUNTER — Ambulatory Visit (INDEPENDENT_AMBULATORY_CARE_PROVIDER_SITE_OTHER)
Admission: RE | Admit: 2017-08-04 | Discharge: 2017-08-04 | Disposition: A | Discharge status: Home / Self Care | Payer: 59 | Source: Ambulatory Visit | Attending: Gastroenterology | Admitting: Gastroenterology

## 2017-08-04 DIAGNOSIS — Z189 Retained foreign body fragments, unspecified material: Secondary | ICD-10-CM

## 2017-12-13 ENCOUNTER — Other Ambulatory Visit: Payer: Self-pay | Admitting: Internal Medicine

## 2017-12-13 DIAGNOSIS — Z1231 Encounter for screening mammogram for malignant neoplasm of breast: Secondary | ICD-10-CM

## 2018-01-01 ENCOUNTER — Ambulatory Visit
Admission: RE | Admit: 2018-01-01 | Discharge: 2018-01-01 | Disposition: A | Discharge status: Home / Self Care | Payer: 59 | Source: Ambulatory Visit | Attending: Internal Medicine | Admitting: Internal Medicine

## 2018-01-01 DIAGNOSIS — Z1231 Encounter for screening mammogram for malignant neoplasm of breast: Secondary | ICD-10-CM

## 2018-03-20 ENCOUNTER — Encounter: Payer: Self-pay | Admitting: Gastroenterology

## 2018-03-29 ENCOUNTER — Telehealth: Payer: Self-pay | Admitting: Gastroenterology

## 2018-03-29 NOTE — Telephone Encounter (Signed)
Pt is having surgery in a couple of weeks with orthopedics. Amber, PA at orthopedics office called, needs to know if Dr. Silverio Decamp prefers aspirin or xarelto protocol given pt's history. Pls call Amber at [phone]

## 2018-03-30 NOTE — Telephone Encounter (Signed)
Please inform orthopedics that patient has no contraindication to start aspirin or Xarelto from GI standpoint.  She does not have any high risk gastrointestinal lesions at risk for recurrent bleed. Thanks

## 2018-04-12 ENCOUNTER — Inpatient Hospital Stay (HOSPITAL_COMMUNITY): Admission: RE | Admit: 2018-04-12 | Payer: 59 | Source: Ambulatory Visit

## 2018-04-12 ENCOUNTER — Encounter (HOSPITAL_COMMUNITY): Payer: Self-pay

## 2018-04-12 NOTE — Patient Instructions (Addendum)
Your procedure is scheduled on: Monday, April 16, 2018   Surgery Time:  7:15AM-8:05AM   Report to [facility] Main  Entrance    Report to admitting at 5:30 AM   Call this number if you have problems the morning of surgery [phone]   Do not eat food or drink liquids :After Midnight.   Do NOT smoke after Midnight   Take these medicines the morning of surgery with A SIP OF WATER: Synthroid                               You may not have any metal on your body including hair pins, jewelry, and body piercings             Do not wear make-up, lotions, powders, perfumes/cologne, or deodorant             Do not wear nail polish.  Do not shave  48 hours prior to surgery.                Do not bring valuables to the hospital. [place].   Contacts, dentures or bridgework may not be worn into surgery.   Leave suitcase in the car. After surgery it may be brought to your room.   Special Instructions: Bring a copy of your healthcare power of attorney and living will documents         the day of surgery if you haven't scanned them in before.              Please read over the following fact sheets you were given:  [facility] - Preparing for Surgery Before surgery, you can play an important role.  Because skin is not sterile, your skin needs to be as free of germs as possible.  You can reduce the number of germs on your skin by washing with CHG (chlorahexidine gluconate) soap before surgery.  CHG is an antiseptic cleaner which kills germs and bonds with the skin to continue killing germs even after washing. Please DO NOT use if you have an allergy to CHG or antibacterial soaps.  If your skin becomes reddened/irritated stop using the CHG and inform your nurse when you arrive at Short Stay. Do not shave (including legs and underarms) for at least 48 hours prior to the first CHG shower.  You may shave your face/neck.  Please follow  these instructions carefully:  1.  Shower with CHG Soap the night before surgery and the  morning of surgery.  2.  If you choose to wash your hair, wash your hair first as usual with your normal  shampoo.  3.  After you shampoo, rinse your hair and body thoroughly to remove the shampoo.                             4.  Use CHG as you would any other liquid soap.  You can apply chg directly to the skin and wash.  Gently with a scrungie or clean washcloth.  5.  Apply the CHG Soap to your body ONLY FROM THE NECK DOWN.   Do   not use on face/ open  Wound or open sores. Avoid contact with eyes, ears mouth and   genitals (private parts).                       Wash face,  Genitals (private parts) with your normal soap.             6.  Wash thoroughly, paying special attention to the area where your    surgery  will be performed.  7.  Thoroughly rinse your body with warm water from the neck down.  8.  DO NOT shower/wash with your normal soap after using and rinsing off the CHG Soap.                9.  Pat yourself dry with a clean towel.            10.  Wear clean pajamas.            11.  Place clean sheets on your bed the night of your first shower and do not  sleep with pets. Day of Surgery : Do not apply any lotions/deodorants the morning of surgery.  Please wear clean clothes to the hospital/surgery center.  FAILURE TO FOLLOW THESE INSTRUCTIONS MAY RESULT IN THE CANCELLATION OF YOUR SURGERY  PATIENT SIGNATURE_________________________________  NURSE SIGNATURE__________________________________  ________________________________________________________________________   Adam Phenix  An incentive spirometer is a tool that can help keep your lungs clear and active. This tool measures how well you are filling your lungs with each breath. Taking long deep breaths may help reverse or decrease the chance of developing breathing (pulmonary) problems (especially  infection) following:  A long period of time when you are unable to move or be active. BEFORE THE PROCEDURE   If the spirometer includes an indicator to show your best effort, your nurse or respiratory therapist will set it to a desired goal.  If possible, sit up straight or lean slightly forward. Try not to slouch.  Hold the incentive spirometer in an upright position. INSTRUCTIONS FOR USE  1. Sit on the edge of your bed if possible, or sit up as far as you can in bed or on a chair. 2. Hold the incentive spirometer in an upright position. 3. Breathe out normally. 4. Place the mouthpiece in your mouth and seal your lips tightly around it. 5. Breathe in slowly and as deeply as possible, raising the piston or the ball toward the top of the column. 6. Hold your breath for 3-5 seconds or for as long as possible. Allow the piston or ball to fall to the bottom of the column. 7. Remove the mouthpiece from your mouth and breathe out normally. 8. Rest for a few seconds and repeat Steps 1 through 7 at least 10 times every 1-2 hours when you are awake. Take your time and take a few normal breaths between deep breaths. 9. The spirometer may include an indicator to show your best effort. Use the indicator as a goal to work toward during each repetition. 10. After each set of 10 deep breaths, practice coughing to be sure your lungs are clear. If you have an incision (the cut made at the time of surgery), support your incision when coughing by placing a pillow or rolled up towels firmly against it. Once you are able to get out of bed, walk around indoors and cough well. You may stop using the incentive spirometer when instructed by your caregiver.  RISKS AND COMPLICATIONS  Take your time  so you do not get dizzy or light-headed.  If you are in pain, you may need to take or ask for pain medication before doing incentive spirometry. It is harder to take a deep breath if you are having pain. AFTER  USE  Rest and breathe slowly and easily.  It can be helpful to keep track of a log of your progress. Your caregiver can provide you with a simple table to help with this. If you are using the spirometer at home, follow these instructions: [place] IF:   You are having difficultly using the spirometer.  You have trouble using the spirometer as often as instructed.  Your pain medication is not giving enough relief while using the spirometer.  You develop fever of 100.5 F (38.1 C) or higher. SEEK IMMEDIATE MEDICAL CARE IF:   You cough up bloody sputum that had not been present before.  You develop fever of 102 F (38.9 C) or greater.  You develop worsening pain at or near the incision site. MAKE SURE YOU:   Understand these instructions.  Will watch your condition.  Will get help right away if you are not doing well or get worse. Document Released: 01/30/2007 Document Revised: 12/12/2011 Document Reviewed: 04/02/2007 ExitCare Patient Information 2014 ExitCare, [place].   ________________________________________________________________________  WHAT IS A BLOOD TRANSFUSION? Blood Transfusion Information  A transfusion is the replacement of blood or some of its parts. Blood is made up of multiple cells which provide different functions.  Red blood cells carry oxygen and are used for blood loss replacement.  White blood cells fight against infection.  Platelets control bleeding.  Plasma helps clot blood.  Other blood products are available for specialized needs, such as hemophilia or other clotting disorders. BEFORE THE TRANSFUSION  Who gives blood for transfusions?   Healthy volunteers who are fully evaluated to make sure their blood is safe. This is blood bank blood. Transfusion therapy is the safest it has ever been in the practice of medicine. Before blood is taken from a donor, a complete history is taken to make sure that person has no history of diseases  nor engages in risky social behavior (examples are intravenous drug use or sexual activity with multiple partners). The donor's travel history is screened to minimize risk of transmitting infections, such as malaria. The donated blood is tested for signs of infectious diseases, such as HIV and hepatitis. The blood is then tested to be sure it is compatible with you in order to minimize the chance of a transfusion reaction. If you or a relative donates blood, this is often done in anticipation of surgery and is not appropriate for emergency situations. It takes many days to process the donated blood. RISKS AND COMPLICATIONS Although transfusion therapy is very safe and saves many lives, the main dangers of transfusion include:   Getting an infectious disease.  Developing a transfusion reaction. This is an allergic reaction to something in the blood you were given. Every precaution is taken to prevent this. The decision to have a blood transfusion has been considered carefully by your caregiver before blood is given. Blood is not given unless the benefits outweigh the risks. AFTER THE TRANSFUSION  Right after receiving a blood transfusion, you will usually feel much better and more energetic. This is especially true if your red blood cells have gotten low (anemic). The transfusion raises the level of the red blood cells which carry oxygen, and this usually causes an energy increase.  The  nurse administering the transfusion will monitor you carefully for complications. HOME CARE INSTRUCTIONS  No special instructions are needed after a transfusion. You may find your energy is better. Speak with your caregiver about any limitations on activity for underlying diseases you may have. SEEK MEDICAL CARE IF:   Your condition is not improving after your transfusion.  You develop redness or irritation at the intravenous (IV) site. SEEK IMMEDIATE MEDICAL CARE IF:  Any of the following symptoms occur over the  next 12 hours:  Shaking chills.  You have a temperature by mouth above 102 F (38.9 C), not controlled by medicine.  Chest, back, or muscle pain.  People around you feel you are not acting correctly or are confused.  Shortness of breath or difficulty breathing.  Dizziness and fainting.  You get a rash or develop hives.  You have a decrease in urine output.  Your urine turns a dark color or changes to pink, red, or brown. Any of the following symptoms occur over the next 10 days:  You have a temperature by mouth above 102 F (38.9 C), not controlled by medicine.  Shortness of breath.  Weakness after normal activity.  The white part of the eye turns yellow (jaundice).  You have a decrease in the amount of urine or are urinating less often.  Your urine turns a dark color or changes to pink, red, or brown. Document Released: 09/16/2000 Document Revised: 12/12/2011 Document Reviewed: 05/05/2008 [facility] Patient Information 2014 [place], [place].  _______________________________________________________________________

## 2018-04-13 ENCOUNTER — Other Ambulatory Visit: Payer: Self-pay

## 2018-04-13 ENCOUNTER — Encounter (HOSPITAL_COMMUNITY): Payer: Self-pay

## 2018-04-13 ENCOUNTER — Encounter (HOSPITAL_COMMUNITY)
Admission: RE | Admit: 2018-04-13 | Discharge: 2018-04-13 | Disposition: A | Discharge status: Home / Self Care | Payer: 59 | Source: Ambulatory Visit | Attending: Orthopedic Surgery | Admitting: Orthopedic Surgery

## 2018-04-13 DIAGNOSIS — Z01812 Encounter for preprocedural laboratory examination: Secondary | ICD-10-CM | POA: Insufficient documentation

## 2018-04-13 HISTORY — DX: Unspecified ovarian cyst, left side: N83.202

## 2018-04-13 HISTORY — DX: Microscopic colitis, unspecified: K52.839

## 2018-04-13 HISTORY — DX: Other specified postprocedural states: R11.2

## 2018-04-13 HISTORY — DX: Other specified postprocedural states: Z98.890

## 2018-04-13 HISTORY — DX: Syncope and collapse: R55

## 2018-04-13 HISTORY — DX: Other hemorrhoids: K64.8

## 2018-04-13 LAB — URINALYSIS, ROUTINE W REFLEX MICROSCOPIC
BILIRUBIN URINE: NEGATIVE
Glucose, UA: NEGATIVE mg/dL
HGB URINE DIPSTICK: NEGATIVE
Ketones, ur: NEGATIVE mg/dL
Leukocytes, UA: NEGATIVE
Nitrite: NEGATIVE
Protein, ur: NEGATIVE mg/dL
SPECIFIC GRAVITY, URINE: 1.013 (ref 1.005–1.030)
pH: 8 (ref 5.0–8.0)

## 2018-04-13 LAB — ABO/RH: ABO/RH(D): O POS

## 2018-04-13 LAB — COMPREHENSIVE METABOLIC PANEL
ALT: 24 U/L (ref 0–44)
ANION GAP: 7 (ref 5–15)
AST: 28 U/L (ref 15–41)
Albumin: 4.3 g/dL (ref 3.5–5.0)
Alkaline Phosphatase: 48 U/L (ref 38–126)
BILIRUBIN TOTAL: 0.5 mg/dL (ref 0.3–1.2)
BUN: 16 mg/dL (ref 8–23)
CHLORIDE: 106 mmol/L (ref 98–111)
CO2: 29 mmol/L (ref 22–32)
Calcium: 9.3 mg/dL (ref 8.9–10.3)
Creatinine, Ser: 0.58 mg/dL (ref 0.44–1.00)
Glucose, Bld: 113 mg/dL — ABNORMAL HIGH (ref 70–99)
POTASSIUM: 4 mmol/L (ref 3.5–5.1)
Sodium: 142 mmol/L (ref 135–145)
TOTAL PROTEIN: 7 g/dL (ref 6.5–8.1)

## 2018-04-13 LAB — CBC
HCT: 39.7 % (ref 36.0–46.0)
Hemoglobin: 13.2 g/dL (ref 12.0–15.0)
MCH: 31.8 pg (ref 26.0–34.0)
MCHC: 33.2 g/dL (ref 30.0–36.0)
MCV: 95.7 fL (ref 78.0–100.0)
PLATELETS: 267 10*3/uL (ref 150–400)
RBC: 4.15 MIL/uL (ref 3.87–5.11)
RDW: 13.6 % (ref 11.5–15.5)
WBC: 5.4 10*3/uL (ref 4.0–10.5)

## 2018-04-13 LAB — APTT: APTT: 31 s (ref 24–36)

## 2018-04-13 LAB — PROTIME-INR
INR: 0.97
PROTHROMBIN TIME: 12.8 s (ref 11.4–15.2)

## 2018-04-13 LAB — SURGICAL PCR SCREEN
MRSA, PCR: NEGATIVE
Staphylococcus aureus: NEGATIVE

## 2018-04-13 NOTE — Pre-Procedure Instructions (Signed)
EKG 03/11/2018 in chart

## 2018-04-13 NOTE — Pre-Procedure Instructions (Signed)
CMP results 04/13/2018 faxed to Dr. Wynelle Link via epic.

## 2018-04-13 NOTE — H&P (Signed)
TOTAL KNEE ADMISSION H&P  Patient is being admitted for right total knee arthroplasty.  Subjective:  Chief Complaint:right knee pain.  HPI: Vanessa Larsen, 72 y.o. female, has a history of pain and functional disability in the right knee due to arthritis and has failed non-surgical conservative treatments for greater than 12 weeks to includeNSAID's and/or analgesics, corticosteriod injections, flexibility and strengthening excercises and activity modification.  Onset of symptoms was gradual, starting 5 years ago with gradually worsening course since that time. The patient noted no past surgery on the right knee(s).  Patient currently rates pain in the right knee(s) at 7 out of 10 with activity. Patient has night pain, worsening of pain with activity and weight bearing, pain that interferes with activities of daily living, pain with passive range of motion, crepitus and joint swelling.  Patient has evidence of periarticular osteophytes and joint space narrowing by imaging studies.  There is no active infection.  Patient Active Problem List   Diagnosis Date Noted  . Thyroid disease   . HYPERTHYROIDISM 12/13/2007  . IBS 12/13/2007   Past Medical History:  Diagnosis Date  . Anal fissure   . Arthritis   . Colitis    Microscopic colitis  . Elevated cholesterol   . Hyperplastic colon polyp   . Hypothyroidism   . IBS (irritable bowel syndrome)   . Internal hemorrhoids    Small  . Osteoarthritis   . Ovarian cyst, left     Past Surgical History:  Procedure Laterality Date  . BREAST BIOPSY Left    neg  . CATARACT EXTRACTION, BILATERAL    . COLONOSCOPY    . COLPOSCOPY    . EYE SURGERY     cosmetic  . GYNECOLOGIC CRYOSURGERY     age 89  . UPPER GI ENDOSCOPY      Current Facility-Administered Medications  Medication Dose Route Frequency Provider Last Rate Last Dose  . 0.9 %  sodium chloride infusion  500 mL Intravenous Continuous [place] Dragon, MD      . 0.9 %  sodium chloride  infusion  500 mL Intravenous Continuous Nandigam, Venia Minks, MD       Current Outpatient Medications  Medication Sig Dispense Refill Last Dose  . atorvastatin (LIPITOR) 20 MG tablet Take 20 mg by mouth every evening.    Taking  . Calcium Carb-Cholecalciferol (CALCIUM 1000 + D PO) Take 1 capsule by mouth daily.     Taking  . levothyroxine (SYNTHROID, LEVOTHROID) 100 MCG tablet Take 100 mcg by mouth daily before breakfast.    Taking  . Multiple Vitamins-Minerals (MULTIVITAMIN WITH MINERALS) tablet Take 1 tablet by mouth daily.     Taking   Allergies  Allergen Reactions  . Fentanyl Nausea And Vomiting    Social History   Tobacco Use  . Smoking status: Former Smoker    Last attempt to quit: 10/04/1971    Years since quitting: 46.5  . Smokeless tobacco: Never Used  Substance Use Topics  . Alcohol use: Yes    Alcohol/week: 8.4 oz    Types: 14 Standard drinks or equivalent per week    Comment: 2 drinks per day    Family History  Problem Relation Age of Onset  . Pancreatic cancer Father   . Heart disease Father   . Esophageal cancer Mother   . Diabetes Cousin   . Heart disease Paternal Grandmother   . Peptic Ulcer Sister   . Gallstones Sister   . Colon cancer Neg Hx   .  Breast cancer Neg Hx      Review of Systems  Constitutional: Negative.   HENT: Negative.   Eyes: Negative.   Respiratory: Negative.   Cardiovascular: Negative.   Gastrointestinal: Negative.   Genitourinary: Negative.   Musculoskeletal: Positive for joint pain and myalgias. Negative for back pain, falls and neck pain.  Skin: Negative.   Neurological: Negative.   Endo/Heme/Allergies: Negative.   Psychiatric/Behavioral: Negative.     Objective:  Physical Exam  Constitutional: She is oriented to person, place, and time. She appears well-developed. No distress.  HENT:  Head: Normocephalic and atraumatic.  Right Ear: External ear normal.  Left Ear: External ear normal.  Nose: Nose normal.  Mouth/Throat:  Oropharynx is clear and moist.  Eyes: Conjunctivae and EOM are normal.  Neck: Normal range of motion. Neck supple.  Cardiovascular: Normal rate, regular rhythm, normal heart sounds and intact distal pulses.  No murmur heard. Respiratory: Effort normal and breath sounds normal. No respiratory distress. She has no wheezes.  GI: Soft. Bowel sounds are normal. She exhibits no distension. There is no tenderness.  Musculoskeletal:  Right Hip Exam: ROM: is normal without discomfort. There is no tenderness over the greater trochanter. There is no pain on provocative testing of the hip.  Right Knee Exam: No effusion. Varus deformity Range of motion is 0-135 degrees. Moderate crepitus on range of motion of the knee. No medial joint line tenderness. Tenderness is present medially. Stable knee.  Left Knee Exam: No effusion. Range of motion is 0-125 degrees. No crepitus on range of motion of the knee. No medial or lateral joint line tenderness. Stable knee.  Neurological: She is alert and oriented to person, place, and time. She has normal strength and normal reflexes. No sensory deficit.  Skin: No rash noted. She is not diaphoretic. No erythema.  Psychiatric: She has a normal mood and affect. Her behavior is normal.      Imaging Review Plain radiographs demonstrate severe degenerative joint disease of the right knee(s). The overall alignment ismild varus. The bone quality appears to be good for age and reported activity level.   Preoperative templating of the joint replacement has been completed, documented, and submitted to the Operating Room personnel in order to optimize intra-operative equipment management.   Anticipated LOS equal to or greater than 2 midnights due to - Age 29 and older with one or more of the following:  - Obesity  - Expected need for hospital services (PT, OT, Nursing) required for safe  discharge  - Anticipated need for postoperative skilled nursing care or  inpatient rehab  - Active co-morbidities: None OR   - Unanticipated findings during/Post Surgery: None  - Patient is a high risk of re-admission due to: None     Assessment/Plan:  End stage primary osteoarthritis, right knee   The patient history, physical examination, clinical judgment of the provider and imaging studies are consistent with end stage degenerative joint disease of the right knee(s) and total knee arthroplasty is deemed medically necessary. The treatment options including medical management, injection therapy arthroscopy and arthroplasty were discussed at length. The risks and benefits of total knee arthroplasty were presented and reviewed. The risks due to aseptic loosening, infection, stiffness, patella tracking problems, thromboembolic complications and other imponderables were discussed. The patient acknowledged the explanation, agreed to proceed with the plan and consent was signed. Patient is being admitted for inpatient treatment for surgery, pain control, PT, OT, prophylactic antibiotics, VTE prophylaxis, progressive ambulation and ADL's and discharge planning.  The patient is planning to be discharged home with outpatient therapy    Therapy Plans: outpatient therapy at [facility] Disposition: Home with husband Planned DVT prophylaxis: aspirin 325mg  BID vs Xarelto 10mg  daily DME needed: walker and 3-n-1 PCP: Dr. Richarda Overlie, MD GI: [place]- Nandigam Other: TXA IV lymphocytic colitis- no NSAIDs   Ardeen Jourdain, PA-C

## 2018-04-15 MED ORDER — BUPIVACAINE LIPOSOME 1.3 % IJ SUSP
20.0000 mL | INTRAMUSCULAR | Status: DC
  Filled 2018-04-15: qty 20

## 2018-04-15 MED ORDER — TRANEXAMIC ACID 1000 MG/10ML IV SOLN
1000.0000 mg | INTRAVENOUS | Status: AC
  Administered 2018-04-16: 1000 mg via INTRAVENOUS
  Filled 2018-04-15: qty 1100

## 2018-04-15 NOTE — Anesthesia Preprocedure Evaluation (Addendum)
Anesthesia Evaluation  Patient identified by MRN, date of birth, ID band Patient awake    Reviewed: Allergy & Precautions, NPO status , Patient's Chart, lab work & pertinent test results  History of Anesthesia Complications (+) PONV  Airway Mallampati: II  TM Distance: >3 FB     Dental   Pulmonary former smoker,    breath sounds clear to auscultation       Cardiovascular negative cardio ROS   Rhythm:Regular Rate:Normal     Neuro/Psych negative neurological ROS     GI/Hepatic negative GI ROS, Neg liver ROS,   Endo/Other  Hypothyroidism   Renal/GU negative Renal ROS     Musculoskeletal  (+) Arthritis ,   Abdominal   Peds  Hematology negative hematology ROS (+)   Anesthesia Other Findings   Reproductive/Obstetrics                            Lab Results  Component Value Date   WBC 5.4 04/13/2018   HGB 13.2 04/13/2018   HCT 39.7 04/13/2018   MCV 95.7 04/13/2018   PLT 267 04/13/2018   Lab Results  Component Value Date   CREATININE 0.58 04/13/2018   BUN 16 04/13/2018   NA 142 04/13/2018   K 4.0 04/13/2018   CL 106 04/13/2018   CO2 29 04/13/2018   Lab Results  Component Value Date   INR 0.97 04/13/2018    Anesthesia Physical Anesthesia Plan  ASA: II  Anesthesia Plan: Spinal and MAC   Post-op Pain Management:  Regional for Post-op pain   Induction: Intravenous  PONV Risk Score and Plan: 3 and Midazolam, Propofol infusion, Ondansetron and Treatment may vary due to age or medical condition  Airway Management Planned: Natural Airway and Simple Face Mask  Additional Equipment:   Intra-op Plan:   Post-operative Plan:   Informed Consent: I have reviewed the patients History and Physical, chart, labs and discussed the procedure including the risks, benefits and alternatives for the proposed anesthesia with the patient or authorized representative who has indicated his/her  understanding and acceptance.     Plan Discussed with: CRNA  Anesthesia Plan Comments:        Anesthesia Quick Evaluation

## 2018-04-16 ENCOUNTER — Ambulatory Visit (HOSPITAL_COMMUNITY): Payer: 59 | Admitting: Anesthesiology

## 2018-04-16 ENCOUNTER — Encounter (HOSPITAL_COMMUNITY): Payer: Self-pay | Admitting: Certified Registered Nurse Anesthetist

## 2018-04-16 ENCOUNTER — Other Ambulatory Visit: Payer: Self-pay

## 2018-04-16 ENCOUNTER — Encounter (HOSPITAL_COMMUNITY)
Admission: RE | Disposition: A | Discharge status: Home / Self Care | Payer: Self-pay | Source: Ambulatory Visit | Attending: Orthopedic Surgery

## 2018-04-16 ENCOUNTER — Observation Stay (HOSPITAL_COMMUNITY)
Admission: RE | Admit: 2018-04-16 | Discharge: 2018-04-18 | Disposition: A | Discharge status: Home / Self Care | Payer: 59 | Source: Ambulatory Visit | Attending: Orthopedic Surgery | Admitting: Orthopedic Surgery

## 2018-04-16 DIAGNOSIS — M1711 Unilateral primary osteoarthritis, right knee: Principal | ICD-10-CM | POA: Insufficient documentation

## 2018-04-16 DIAGNOSIS — E7800 Pure hypercholesterolemia, unspecified: Secondary | ICD-10-CM | POA: Insufficient documentation

## 2018-04-16 DIAGNOSIS — E039 Hypothyroidism, unspecified: Secondary | ICD-10-CM | POA: Diagnosis not present

## 2018-04-16 DIAGNOSIS — Z87891 Personal history of nicotine dependence: Secondary | ICD-10-CM | POA: Diagnosis not present

## 2018-04-16 DIAGNOSIS — Z79899 Other long term (current) drug therapy: Secondary | ICD-10-CM | POA: Diagnosis not present

## 2018-04-16 DIAGNOSIS — M179 Osteoarthritis of knee, unspecified: Secondary | ICD-10-CM | POA: Diagnosis present

## 2018-04-16 DIAGNOSIS — M1710 Unilateral primary osteoarthritis, unspecified knee: Secondary | ICD-10-CM

## 2018-04-16 DIAGNOSIS — M25761 Osteophyte, right knee: Secondary | ICD-10-CM | POA: Insufficient documentation

## 2018-04-16 HISTORY — PX: TOTAL KNEE ARTHROPLASTY: SHX125

## 2018-04-16 LAB — TYPE AND SCREEN
ABO/RH(D): O POS
ANTIBODY SCREEN: NEGATIVE

## 2018-04-16 SURGERY — ARTHROPLASTY, KNEE, TOTAL
Anesthesia: Spinal | Site: Knee | Laterality: Right

## 2018-04-16 MED ORDER — DEXAMETHASONE SODIUM PHOSPHATE 10 MG/ML IJ SOLN
8.0000 mg | Freq: Once | INTRAMUSCULAR | Status: AC
  Administered 2018-04-16: 10 mg via INTRAVENOUS

## 2018-04-16 MED ORDER — ONDANSETRON HCL 4 MG/2ML IJ SOLN
INTRAMUSCULAR | Status: DC | PRN
  Administered 2018-04-16: 4 mg via INTRAVENOUS

## 2018-04-16 MED ORDER — CEFAZOLIN SODIUM-DEXTROSE 2-4 GM/100ML-% IV SOLN
2.0000 g | Freq: Four times a day (QID) | INTRAVENOUS | Status: AC
  Administered 2018-04-16 (×2): 2 g via INTRAVENOUS
  Filled 2018-04-16 (×2): qty 100

## 2018-04-16 MED ORDER — BUPIVACAINE HCL (PF) 0.25 % IJ SOLN
INTRAMUSCULAR | Status: AC
  Filled 2018-04-16: qty 30

## 2018-04-16 MED ORDER — MIDAZOLAM HCL 5 MG/5ML IJ SOLN
INTRAMUSCULAR | Status: DC | PRN
  Administered 2018-04-16 (×2): 1 mg via INTRAVENOUS

## 2018-04-16 MED ORDER — SODIUM CHLORIDE 0.9 % IR SOLN
Status: DC | PRN
  Administered 2018-04-16: 1000 mL

## 2018-04-16 MED ORDER — TRAMADOL HCL 50 MG PO TABS
50.0000 mg | ORAL_TABLET | Freq: Four times a day (QID) | ORAL | Status: DC | PRN
  Administered 2018-04-17: 100 mg via ORAL
  Administered 2018-04-17: 50 mg via ORAL
  Administered 2018-04-18: 100 mg via ORAL
  Filled 2018-04-16: qty 2
  Filled 2018-04-16: qty 1
  Filled 2018-04-16: qty 2

## 2018-04-16 MED ORDER — PHENYLEPHRINE HCL 10 MG/ML IJ SOLN
INTRAVENOUS | Status: DC | PRN
  Administered 2018-04-16: 25 ug/min via INTRAVENOUS

## 2018-04-16 MED ORDER — PROPOFOL 10 MG/ML IV BOLUS
INTRAVENOUS | Status: AC
  Filled 2018-04-16: qty 40

## 2018-04-16 MED ORDER — METOCLOPRAMIDE HCL 5 MG/ML IJ SOLN
5.0000 mg | Freq: Three times a day (TID) | INTRAMUSCULAR | Status: DC | PRN
  Administered 2018-04-16: 10 mg via INTRAVENOUS
  Filled 2018-04-16: qty 2

## 2018-04-16 MED ORDER — TRANEXAMIC ACID 1000 MG/10ML IV SOLN
1000.0000 mg | Freq: Once | INTRAVENOUS | Status: AC
  Administered 2018-04-16: 1000 mg via INTRAVENOUS
  Filled 2018-04-16: qty 1100

## 2018-04-16 MED ORDER — METHOCARBAMOL 1000 MG/10ML IJ SOLN
500.0000 mg | Freq: Four times a day (QID) | INTRAVENOUS | Status: DC | PRN
  Administered 2018-04-16: 500 mg via INTRAVENOUS
  Filled 2018-04-16: qty 550

## 2018-04-16 MED ORDER — ACETAMINOPHEN 500 MG PO TABS
1000.0000 mg | ORAL_TABLET | Freq: Four times a day (QID) | ORAL | Status: AC
  Administered 2018-04-16 – 2018-04-17 (×4): 1000 mg via ORAL
  Filled 2018-04-16 (×4): qty 2

## 2018-04-16 MED ORDER — SODIUM CHLORIDE 0.9 % IJ SOLN
INTRAMUSCULAR | Status: AC
  Filled 2018-04-16: qty 50

## 2018-04-16 MED ORDER — METOCLOPRAMIDE HCL 5 MG PO TABS: 5.0000 mg | ORAL_TABLET | Freq: Three times a day (TID) | ORAL | Status: DC | PRN

## 2018-04-16 MED ORDER — SODIUM CHLORIDE 0.9 % IJ SOLN
INTRAMUSCULAR | Status: AC
  Filled 2018-04-16: qty 10

## 2018-04-16 MED ORDER — ONDANSETRON HCL 4 MG/2ML IJ SOLN
4.0000 mg | Freq: Four times a day (QID) | INTRAMUSCULAR | Status: DC | PRN
  Administered 2018-04-16: 4 mg via INTRAVENOUS
  Filled 2018-04-16: qty 2

## 2018-04-16 MED ORDER — MIDAZOLAM HCL 2 MG/2ML IJ SOLN
INTRAMUSCULAR | Status: AC
  Filled 2018-04-16: qty 2

## 2018-04-16 MED ORDER — BUPIVACAINE LIPOSOME 1.3 % IJ SUSP
INTRAMUSCULAR | Status: DC | PRN
  Administered 2018-04-16: 20 mL

## 2018-04-16 MED ORDER — ONDANSETRON HCL 4 MG/2ML IJ SOLN
INTRAMUSCULAR | Status: AC
  Filled 2018-04-16: qty 2

## 2018-04-16 MED ORDER — DOCUSATE SODIUM 100 MG PO CAPS
100.0000 mg | ORAL_CAPSULE | Freq: Two times a day (BID) | ORAL | Status: DC
  Administered 2018-04-16 – 2018-04-18 (×3): 100 mg via ORAL
  Filled 2018-04-16 (×4): qty 1

## 2018-04-16 MED ORDER — LEVOTHYROXINE SODIUM 100 MCG PO TABS
100.0000 ug | ORAL_TABLET | Freq: Every day | ORAL | Status: DC
  Administered 2018-04-17 – 2018-04-18 (×2): 100 ug via ORAL
  Filled 2018-04-16 (×2): qty 1

## 2018-04-16 MED ORDER — GABAPENTIN 300 MG PO CAPS
300.0000 mg | ORAL_CAPSULE | Freq: Three times a day (TID) | ORAL | Status: DC
  Administered 2018-04-16 – 2018-04-18 (×6): 300 mg via ORAL
  Filled 2018-04-16 (×6): qty 1

## 2018-04-16 MED ORDER — CEFAZOLIN SODIUM-DEXTROSE 2-4 GM/100ML-% IV SOLN
2.0000 g | INTRAVENOUS | Status: AC
  Administered 2018-04-16: 2 g via INTRAVENOUS
  Filled 2018-04-16: qty 100

## 2018-04-16 MED ORDER — PROMETHAZINE HCL 25 MG/ML IJ SOLN
6.2500 mg | Freq: Four times a day (QID) | INTRAMUSCULAR | Status: DC | PRN
  Administered 2018-04-16: 12.5 mg via INTRAVENOUS
  Filled 2018-04-16: qty 1

## 2018-04-16 MED ORDER — HYDROMORPHONE HCL 1 MG/ML IJ SOLN
INTRAMUSCULAR | Status: AC
  Filled 2018-04-16: qty 1

## 2018-04-16 MED ORDER — GABAPENTIN 300 MG PO CAPS
300.0000 mg | ORAL_CAPSULE | Freq: Once | ORAL | Status: AC
  Administered 2018-04-16: 300 mg via ORAL
  Filled 2018-04-16: qty 1

## 2018-04-16 MED ORDER — MENTHOL 3 MG MT LOZG: 1.0000 | LOZENGE | OROMUCOSAL | Status: DC | PRN

## 2018-04-16 MED ORDER — METHOCARBAMOL 500 MG PO TABS
500.0000 mg | ORAL_TABLET | Freq: Four times a day (QID) | ORAL | Status: DC | PRN
  Administered 2018-04-16 – 2018-04-18 (×5): 500 mg via ORAL
  Filled 2018-04-16 (×5): qty 1

## 2018-04-16 MED ORDER — HYDROMORPHONE HCL 1 MG/ML IJ SOLN
0.2500 mg | INTRAMUSCULAR | Status: DC | PRN
  Administered 2018-04-16: 0.5 mg via INTRAVENOUS

## 2018-04-16 MED ORDER — SODIUM CHLORIDE 0.9 % IV SOLN
INTRAVENOUS | Status: DC
  Administered 2018-04-16: 10:00:00 via INTRAVENOUS

## 2018-04-16 MED ORDER — 0.9 % SODIUM CHLORIDE (POUR BTL) OPTIME
TOPICAL | Status: DC | PRN
Start: 2018-04-16 — End: 2018-04-16
  Administered 2018-04-16: 1000 mL

## 2018-04-16 MED ORDER — DIPHENHYDRAMINE HCL 12.5 MG/5ML PO ELIX: 12.5000 mg | ORAL_SOLUTION | ORAL | Status: DC | PRN

## 2018-04-16 MED ORDER — HYDROMORPHONE HCL 1 MG/ML IJ SOLN: 0.2500 mg | INTRAMUSCULAR | Status: DC | PRN

## 2018-04-16 MED ORDER — CHLORHEXIDINE GLUCONATE 4 % EX LIQD: 60.0000 mL | Freq: Once | CUTANEOUS | Status: DC

## 2018-04-16 MED ORDER — ROPIVACAINE HCL 7.5 MG/ML IJ SOLN
INTRAMUSCULAR | Status: DC | PRN
  Administered 2018-04-16: 20 mL via PERINEURAL

## 2018-04-16 MED ORDER — BISACODYL 10 MG RE SUPP: 10.0000 mg | Freq: Every day | RECTAL | Status: DC | PRN

## 2018-04-16 MED ORDER — STERILE WATER FOR IRRIGATION IR SOLN
Status: DC | PRN
Start: 2018-04-16 — End: 2018-04-16
  Administered 2018-04-16: 2000 mL

## 2018-04-16 MED ORDER — ASPIRIN EC 325 MG PO TBEC
325.0000 mg | DELAYED_RELEASE_TABLET | Freq: Two times a day (BID) | ORAL | Status: DC
  Administered 2018-04-17 – 2018-04-18 (×3): 325 mg via ORAL
  Filled 2018-04-16 (×3): qty 1

## 2018-04-16 MED ORDER — PHENOL 1.4 % MT LIQD: 1.0000 | OROMUCOSAL | Status: DC | PRN

## 2018-04-16 MED ORDER — LACTATED RINGERS IV SOLN
INTRAVENOUS | Status: DC
  Administered 2018-04-16 (×2): via INTRAVENOUS

## 2018-04-16 MED ORDER — OXYCODONE HCL 5 MG PO TABS
10.0000 mg | ORAL_TABLET | ORAL | Status: DC | PRN
  Filled 2018-04-16: qty 2

## 2018-04-16 MED ORDER — SODIUM CHLORIDE 0.9 % IJ SOLN
INTRAMUSCULAR | Status: DC | PRN
Start: 2018-04-16 — End: 2018-04-16
  Administered 2018-04-16: 60 mL

## 2018-04-16 MED ORDER — POLYETHYLENE GLYCOL 3350 17 G PO PACK: 17.0000 g | PACK | Freq: Every day | ORAL | Status: DC | PRN

## 2018-04-16 MED ORDER — ACETAMINOPHEN 10 MG/ML IV SOLN
1000.0000 mg | Freq: Four times a day (QID) | INTRAVENOUS | Status: DC
  Administered 2018-04-16: 1000 mg via INTRAVENOUS
  Filled 2018-04-16: qty 100

## 2018-04-16 MED ORDER — DEXAMETHASONE SODIUM PHOSPHATE 10 MG/ML IJ SOLN
10.0000 mg | Freq: Once | INTRAMUSCULAR | Status: AC
  Administered 2018-04-17: 10 mg via INTRAVENOUS
  Filled 2018-04-16: qty 1

## 2018-04-16 MED ORDER — PROPOFOL 10 MG/ML IV BOLUS
INTRAVENOUS | Status: AC
  Filled 2018-04-16: qty 20

## 2018-04-16 MED ORDER — FLEET ENEMA 7-19 GM/118ML RE ENEM: 1.0000 | ENEMA | Freq: Once | RECTAL | Status: DC | PRN

## 2018-04-16 MED ORDER — ONDANSETRON HCL 4 MG PO TABS
4.0000 mg | ORAL_TABLET | Freq: Four times a day (QID) | ORAL | Status: DC | PRN
  Administered 2018-04-16: 4 mg via ORAL
  Filled 2018-04-16: qty 1

## 2018-04-16 MED ORDER — PROPOFOL 500 MG/50ML IV EMUL
INTRAVENOUS | Status: DC | PRN
  Administered 2018-04-16: 75 ug/kg/min via INTRAVENOUS

## 2018-04-16 MED ORDER — PROMETHAZINE HCL 25 MG/ML IJ SOLN: 6.2500 mg | INTRAMUSCULAR | Status: DC | PRN

## 2018-04-16 MED ORDER — HYDROMORPHONE HCL 1 MG/ML IJ SOLN: 0.5000 mg | INTRAMUSCULAR | Status: DC | PRN

## 2018-04-16 MED ORDER — OXYCODONE HCL 5 MG PO TABS
5.0000 mg | ORAL_TABLET | ORAL | Status: DC | PRN
  Administered 2018-04-16 (×2): 5 mg via ORAL
  Administered 2018-04-17 – 2018-04-18 (×2): 10 mg via ORAL
  Filled 2018-04-16 (×2): qty 1
  Filled 2018-04-16: qty 2

## 2018-04-16 MED ORDER — ATORVASTATIN CALCIUM 20 MG PO TABS
20.0000 mg | ORAL_TABLET | Freq: Every evening | ORAL | Status: DC
  Administered 2018-04-16 – 2018-04-17 (×2): 20 mg via ORAL
  Filled 2018-04-16 (×2): qty 1

## 2018-04-16 MED ORDER — BUPIVACAINE IN DEXTROSE 0.75-8.25 % IT SOLN
INTRATHECAL | Status: DC | PRN
  Administered 2018-04-16: 1.8 mL via INTRATHECAL

## 2018-04-16 SURGICAL SUPPLY — 47 items
BAG ZIPLOCK 12X15 (MISCELLANEOUS) ×3 IMPLANT
BANDAGE ACE 6X5 VEL STRL LF (GAUZE/BANDAGES/DRESSINGS) ×3 IMPLANT
BLADE SAG 18X100X1.27 (BLADE) ×3 IMPLANT
BLADE SAW SGTL 11.0X1.19X90.0M (BLADE) ×3 IMPLANT
BOWL SMART MIX CTS (DISPOSABLE) ×3 IMPLANT
CAPT KNEE TOTAL 3 ATTUNE ×3 IMPLANT
CEMENT HV SMART SET (Cement) ×6 IMPLANT
CLOSURE WOUND 1/2 X4 (GAUZE/BANDAGES/DRESSINGS) ×1
COVER SURGICAL LIGHT HANDLE (MISCELLANEOUS) ×3 IMPLANT
CUFF TOURN SGL QUICK 34 (TOURNIQUET CUFF) ×2
CUFF TRNQT CYL 34X4X40X1 (TOURNIQUET CUFF) ×1 IMPLANT
DECANTER SPIKE VIAL GLASS SM (MISCELLANEOUS) ×3 IMPLANT
DRAPE U-SHAPE 47X51 STRL (DRAPES) ×3 IMPLANT
DRSG ADAPTIC 3X8 NADH LF (GAUZE/BANDAGES/DRESSINGS) ×3 IMPLANT
DRSG PAD ABDOMINAL 8X10 ST (GAUZE/BANDAGES/DRESSINGS) ×3 IMPLANT
DURAPREP 26ML APPLICATOR (WOUND CARE) ×3 IMPLANT
ELECT REM PT RETURN 15FT ADLT (MISCELLANEOUS) ×3 IMPLANT
EVACUATOR 1/8 PVC DRAIN (DRAIN) ×3 IMPLANT
GAUZE SPONGE 4X4 12PLY STRL (GAUZE/BANDAGES/DRESSINGS) ×3 IMPLANT
GLOVE BIO SURGEON STRL SZ8 (GLOVE) ×6 IMPLANT
GLOVE BIOGEL PI IND STRL 8 (GLOVE) ×2 IMPLANT
GLOVE BIOGEL PI INDICATOR 8 (GLOVE) ×4
GLOVE ECLIPSE 8.0 STRL XLNG CF (GLOVE) ×6 IMPLANT
GLOVE SURG SS PI 6.5 STRL IVOR (GLOVE) ×6 IMPLANT
GOWN STRL REUS W/TWL LRG LVL3 (GOWN DISPOSABLE) ×6 IMPLANT
GOWN STRL REUS W/TWL XL LVL3 (GOWN DISPOSABLE) ×3 IMPLANT
HANDPIECE INTERPULSE COAX TIP (DISPOSABLE) ×2
HOLDER FOLEY CATH W/STRAP (MISCELLANEOUS) IMPLANT
IMMOBILIZER KNEE 20 (SOFTGOODS) ×6 IMPLANT
IMMOBILIZER KNEE 20 THIGH 36 (SOFTGOODS) ×1 IMPLANT
MANIFOLD NEPTUNE II (INSTRUMENTS) ×3 IMPLANT
NS IRRIG 1000ML POUR BTL (IV SOLUTION) ×3 IMPLANT
PACK TOTAL KNEE CUSTOM (KITS) ×3 IMPLANT
PAD ABD 8X10 STRL (GAUZE/BANDAGES/DRESSINGS) ×3 IMPLANT
PADDING CAST COTTON 6X4 STRL (CAST SUPPLIES) ×3 IMPLANT
POSITIONER SURGICAL ARM (MISCELLANEOUS) ×3 IMPLANT
SET HNDPC FAN SPRY TIP SCT (DISPOSABLE) ×1 IMPLANT
STRIP CLOSURE SKIN 1/2X4 (GAUZE/BANDAGES/DRESSINGS) ×2 IMPLANT
SUT MNCRL AB 4-0 PS2 18 (SUTURE) ×3 IMPLANT
SUT STRATAFIX 0 PDS 27 VIOLET (SUTURE) ×3
SUT VIC AB 2-0 CT1 27 (SUTURE) ×6
SUT VIC AB 2-0 CT1 TAPERPNT 27 (SUTURE) ×3 IMPLANT
SUTURE STRATFX 0 PDS 27 VIOLET (SUTURE) ×1 IMPLANT
TRAY FOLEY CATH 14FR (SET/KITS/TRAYS/PACK) ×3 IMPLANT
WATER STERILE IRR 1000ML POUR (IV SOLUTION) ×6 IMPLANT
WRAP KNEE MAXI GEL POST OP (GAUZE/BANDAGES/DRESSINGS) ×3 IMPLANT
YANKAUER SUCT BULB TIP 10FT TU (MISCELLANEOUS) ×3 IMPLANT

## 2018-04-16 NOTE — Anesthesia Postprocedure Evaluation (Addendum)
Anesthesia Post Note  Patient: Vanessa Larsen  Procedure(s) Performed: RIGHT TOTAL KNEE ARTHROPLASTY (Right Knee)     Patient location during evaluation: PACU Anesthesia Type: Spinal Level of consciousness: awake and alert Pain management: pain level controlled Vital Signs Assessment: post-procedure vital signs reviewed and stable Respiratory status: spontaneous breathing, nonlabored ventilation, respiratory function stable and patient connected to nasal cannula oxygen Cardiovascular status: blood pressure returned to baseline and stable Postop Assessment: spinal receding Anesthetic complications: no    Last Vitals:  Vitals:   04/16/18 1036 04/16/18 1128  BP: 135/85 140/81  Pulse: 83 83  Resp: 16 16  Temp: 36.4 C 36.4 C  SpO2: 100% 100%    Last Pain:  Vitals:   04/16/18 1128  TempSrc: Oral  PainSc:                  Tiajuana Amass

## 2018-04-16 NOTE — Discharge Instructions (Addendum)
° °Dr. Frank Aluisio °Total Joint Specialist °[facility] °[address] °[place], [place] [address] °[phone] ° °TOTAL KNEE REPLACEMENT POSTOPERATIVE DIRECTIONS ° °Knee Rehabilitation, Guidelines Following Surgery  °Results after knee surgery are often greatly improved when you follow the exercise, range of motion and muscle strengthening exercises prescribed by your doctor. Safety measures are also important to protect the knee from further injury. Any time any of these exercises cause you to have increased pain or swelling in your knee joint, decrease the amount until you are comfortable again and slowly increase them. If you have problems or questions, call your caregiver or physical therapist for advice.  ° °HOME CARE INSTRUCTIONS  °Remove items at home which could result in a fall. This includes throw rugs or furniture in walking pathways.  °· ICE to the affected knee every three hours for 30 minutes at a time and then as needed for pain and swelling.  Continue to use ice on the knee for pain and swelling from surgery. You may notice swelling that will progress down to the foot and ankle.  This is normal after surgery.  Elevate the leg when you are not up walking on it.   °· Continue to use the breathing machine which will help keep your temperature down.  It is common for your temperature to cycle up and down following surgery, especially at night when you are not up moving around and exerting yourself.  The breathing machine keeps your lungs expanded and your temperature down. °· Do not place pillow under knee, focus on keeping the knee straight while resting ° °DIET °You may resume your previous home diet once your are discharged from the hospital. ° °DRESSING / WOUND CARE / SHOWERING °You may change your dressing every day with sterile gauze.  Please use good hand washing techniques before changing the dressing.  Do not use any lotions or creams on the incision until instructed by your  surgeon. °You may start showering once you are discharged home but do not submerge the incision under water. Just pat the incision dry and apply a dry gauze dressing on daily. °Change the surgical dressing daily and reapply a dry dressing each time. ° °ACTIVITY °Walk with your walker as instructed. °Use walker as long as suggested by your caregivers. °Avoid periods of inactivity such as sitting longer than an hour when not asleep. This helps prevent blood clots.  °You may resume a sexual relationship in one month or when given the OK by your doctor.  °You may return to work once you are cleared by your doctor.  °Do not drive a car for 6 weeks or until released by you surgeon.  °Do not drive while taking narcotics. ° °WEIGHT BEARING °Weight bearing as tolerated with assist device (walker, cane, etc) as directed, use it as long as suggested by your surgeon or therapist, typically at least 4-6 weeks. ° °POSTOPERATIVE CONSTIPATION PROTOCOL °Constipation - defined medically as fewer than three stools per week and severe constipation as less than one stool per week. ° °One of the most common issues patients have following surgery is constipation.  Even if you have a regular bowel pattern at home, your normal regimen is likely to be disrupted due to multiple reasons following surgery.  Combination of anesthesia, postoperative narcotics, change in appetite and fluid intake all can affect your bowels.  In order to avoid complications following surgery, here are some recommendations in order to help you during your recovery period. ° °  Colace (docusate) - Pick up an over-the-counter form of Colace or another stool softener and take twice a day as long as you are requiring postoperative pain medications.  Take with a full glass of water daily.  If you experience loose stools or diarrhea, hold the colace until you stool forms back up.  If your symptoms do not get better within 1 week or if they get worse, check with your  doctor.  Dulcolax (bisacodyl) - Pick up over-the-counter and take as directed by the product packaging as needed to assist with the movement of your bowels.  Take with a full glass of water.  Use this product as needed if not relieved by Colace only.   MiraLax (polyethylene glycol) - Pick up over-the-counter to have on hand.  MiraLax is a solution that will increase the amount of water in your bowels to assist with bowel movements.  Take as directed and can mix with a glass of water, juice, soda, coffee, or tea.  Take if you go more than two days without a movement. Do not use MiraLax more than once per day. Call your doctor if you are still constipated or irregular after using this medication for 7 days in a row.  If you continue to have problems with postoperative constipation, please contact the office for further assistance and recommendations.  If you experience "the worst abdominal pain ever" or develop nausea or vomiting, please contact the office immediatly for further recommendations for treatment.  ITCHING  If you experience itching with your medications, try taking only a single pain pill, or even half a pain pill at a time.  You can also use Benadryl over the counter for itching or also to help with sleep.   TED HOSE STOCKINGS Wear the elastic stockings on both legs for three weeks following surgery during the day but you may remove then at night for sleeping.  MEDICATIONS See your medication summary on the After Visit Summary that the nursing staff will review with you prior to discharge.  You may have some home medications which will be placed on hold until you complete the course of blood thinner medication.  It is important for you to complete the blood thinner medication as prescribed by your surgeon.  Continue your approved medications as instructed at time of discharge.  Gabapentin 300 mg Protocol Take a 300 mg capsule three times a day for two weeks, Then a 300 mg capsule  twice a day for two weeks, Then a 300 mg capsule once a day for two weeks, then discontinue the Gabapentin.  PRECAUTIONS If you experience chest pain or shortness of breath - call 911 immediately for transfer to the hospital emergency department.  If you develop a fever greater that 101 F, purulent drainage from wound, increased redness or drainage from wound, foul odor from the wound/dressing, or calf pain - CONTACT YOUR SURGEON.                                                   FOLLOW-UP APPOINTMENTS Make sure you keep all of your appointments after your operation with your surgeon and caregivers. You should call the office at the above phone number and make an appointment for approximately two weeks after the date of your surgery or on the date instructed by your surgeon outlined in the "After  Visit Summary".   RANGE OF MOTION AND STRENGTHENING EXERCISES  Rehabilitation of the knee is important following a knee injury or an operation. After just a few days of immobilization, the muscles of the thigh which control the knee become weakened and shrink (atrophy). Knee exercises are designed to build up the tone and strength of the thigh muscles and to improve knee motion. Often times heat used for twenty to thirty minutes before working out will loosen up your tissues and help with improving the range of motion but do not use heat for the first two weeks following surgery. These exercises can be done on a training (exercise) mat, on the floor, on a table or on a bed. Use what ever works the best and is most comfortable for you Knee exercises include:  Leg Lifts - While your knee is still immobilized in a splint or cast, you can do straight leg raises. Lift the leg to 60 degrees, hold for 3 sec, and slowly lower the leg. Repeat 10-20 times 2-3 times daily. Perform this exercise against resistance later as your knee gets better.  Quad and Hamstring Sets - Tighten up the muscle on the front of the thigh  (Quad) and hold for 5-10 sec. Repeat this 10-20 times hourly. Hamstring sets are done by pushing the foot backward against an object and holding for 5-10 sec. Repeat as with quad sets.   Leg Slides: Lying on your back, slowly slide your foot toward your buttocks, bending your knee up off the floor (only go as far as is comfortable). Then slowly slide your foot back down until your leg is flat on the floor again.  Angel Wings: Lying on your back spread your legs to the side as far apart as you can without causing discomfort.  A rehabilitation program following serious knee injuries can speed recovery and prevent re-injury in the future due to weakened muscles. Contact your doctor or a physical therapist for more information on knee rehabilitation.   IF YOU ARE TRANSFERRED TO A SKILLED REHAB FACILITY If the patient is transferred to a skilled rehab facility following release from the hospital, a list of the current medications will be sent to the facility for the patient to continue.  When discharged from the skilled rehab facility, please have the facility set up the patient's [place] prior to being released. Also, the skilled facility will be responsible for providing the patient with their medications at time of release from the facility to include their pain medication, the muscle relaxants, and their blood thinner medication. If the patient is still at the rehab facility at time of the two week follow up appointment, the skilled rehab facility will also need to assist the patient in arranging follow up appointment in our office and any transportation needs.  MAKE SURE YOU:  Understand these instructions.  Get help right away if you are not doing well or get worse.    Pick up stool softner and laxative for home use following surgery while on pain medications. Do not submerge incision under water. Please use good hand washing techniques while changing dressing each day. May  shower starting three days after surgery. Please use a clean towel to pat the incision dry following showers. Continue to use ice for pain and swelling after surgery. Do not use any lotions or creams on the incision until instructed by your surgeon.

## 2018-04-16 NOTE — Anesthesia Procedure Notes (Signed)
Anesthesia Regional Block:  Pre-Anesthetic Checklist: ,, timeout performed, Correct Patient, Correct Site, Correct Laterality, Correct Procedure, Correct Position, site marked, Risks and benefits discussed,  Surgical consent,  Pre-op evaluation,  At surgeon's request and post-op pain management  Laterality: Right  Prep: chloraprep       Needles:  Injection technique: Single-shot  Needle Type: Echogenic Needle     Needle Length: 9cm  Needle Gauge: 21     Additional Needles:   Procedures:,,,, ultrasound used (permanent image in chart),,,,  Narrative:  Start time: 04/16/2018 6:54 AM End time: 04/16/2018 7:00 AM Injection made incrementally with aspirations every 5 mL.  Performed by: Personally  Anesthesiologist: Suzette Battiest, MD

## 2018-04-16 NOTE — Interval H&P Note (Signed)
History and Physical Interval Note:  04/16/2018 6:24 AM  Vanessa Larsen  has presented today for surgery, with the diagnosis of Osteoarthritis Right Knee  The various methods of treatment have been discussed with the patient and family. After consideration of risks, benefits and other options for treatment, the patient has consented to  Procedure(s): RIGHT TOTAL KNEE ARTHROPLASTY (Right) as a surgical intervention .  The patient's history has been reviewed, patient examined, no change in status, stable for surgery.  I have reviewed the patient's chart and labs.  Questions were answered to the patient's satisfaction.     Pilar Plate Shashank Kwasnik

## 2018-04-16 NOTE — Transfer of Care (Signed)
Immediate Anesthesia Transfer of Care Note  Patient: CHANTEE CERINO  Procedure(s) Performed: RIGHT TOTAL KNEE ARTHROPLASTY (Right Knee)  Patient Location: PACU  Anesthesia Type:Regional and Spinal  Level of Consciousness: awake, alert  and oriented  Airway & Oxygen Therapy: Patient Spontanous Breathing and Patient connected to face mask oxygen  Post-op Assessment: Report given to RN and Post -op Vital signs reviewed and stable  Post vital signs: Reviewed and stable  Last Vitals:  Vitals Value Taken Time  BP 106/66 04/16/2018  8:41 AM  Temp    Pulse 75 04/16/2018  8:42 AM  Resp 17 04/16/2018  8:42 AM  SpO2 98 % 04/16/2018  8:42 AM  Vitals shown include unvalidated device data.  Last Pain:  Vitals:   04/16/18 0559  TempSrc: Oral      Patients Stated Pain Goal: 3 (91/91/66 0600)  Complications: No apparent anesthesia complications

## 2018-04-16 NOTE — Progress Notes (Signed)
PT Cancellation Note  Patient Details Name: Vanessa Larsen MRN: [id number] DOB: [birth year]   Cancelled Treatment:    Reason Eval/Treat Not Completed: Medical issues which prohibited therapy, has been maximally medicated for nausea.  Will check back tomorrow unless improves later today.    Marcelino Freestone PT [phone]  04/16/2018, 2:20 PM

## 2018-04-16 NOTE — Anesthesia Procedure Notes (Signed)
Spinal  Patient location during procedure: OR End time: 04/16/2018 7:11 AM Staffing Anesthesiologist: Suzette Battiest, MD Resident/CRNA: Maxwell Caul, CRNA Performed: resident/CRNA  Preanesthetic Checklist Completed: patient identified, site marked, surgical consent, pre-op evaluation, timeout performed, IV checked, risks and benefits discussed and monitors and equipment checked Spinal Block Patient position: sitting Prep: DuraPrep Patient monitoring: heart rate, continuous pulse ox and blood pressure Approach: midline Location: L3-4 Injection technique: single-shot Needle Needle type: Pencan  Needle gauge: 24 G Needle length: 10 cm Additional Notes Sitting for SAB placement. Single shot. Patient tolerated well. Negative heme/negative paresthesia. Dr Ola Spurr at bedside during entire placement.  LOT # [id number]. Expiration date 04-03-2019.

## 2018-04-16 NOTE — Op Note (Signed)
OPERATIVE REPORT-TOTAL KNEE ARTHROPLASTY   Pre-operative diagnosis- Osteoarthritis  Right knee(s)  Post-operative diagnosis- Osteoarthritis Right knee(s)  Procedure-  Right  Total Knee Arthroplasty  Surgeon- Dione Plover. Katianne Barre, MD  Assistant- Molli Barrows, PA-C   Anesthesia-  Adductor canal block and spinal  EBL-50 mL   Drains Hemovac  Tourniquet time-  Total Tourniquet Time Documented: Thigh (Right) - 33 minutes Total: Thigh (Right) - 33 minutes     Complications- None  Condition-PACU - hemodynamically stable.   Brief Clinical Note  Vanessa Larsen is a 72 y.o. year old female with end stage OA of her right knee with progressively worsening pain and dysfunction. She has constant pain, with activity and at rest and significant functional deficits with difficulties even with ADLs. She has had extensive non-op management including analgesics, injections of cortisone and viscosupplements, and home exercise program, but remains in significant pain with significant dysfunction.Radiographs show bone on bone arthritis medial and patellofemoral. She presents now for right Total Knee Arthroplasty.    Procedure in detail---   The patient is brought into the operating room and positioned supine on the operating table. After successful administration of  Adductor canal block and spinal,   a tourniquet is placed high on the  Right thigh(s) and the lower extremity is prepped and draped in the usual sterile fashion. Time out is performed by the operating team and then the  Right lower extremity is wrapped in Esmarch, knee flexed and the tourniquet inflated to 300 mmHg.       A midline incision is made with a ten blade through the subcutaneous tissue to the level of the extensor mechanism. A fresh blade is used to make a medial parapatellar arthrotomy. Soft tissue over the proximal medial tibia is subperiosteally elevated to the joint line with a knife and into the semimembranosus bursa with a  Cobb elevator. Soft tissue over the proximal lateral tibia is elevated with attention being paid to avoiding the patellar tendon on the tibial tubercle. The patella is everted, knee flexed 90 degrees and the ACL and PCL are removed. Findings are bone on bone medial and patellofemoral with large global osteophytes.        The drill is used to create a starting hole in the distal femur and the canal is thoroughly irrigated with sterile saline to remove the fatty contents. The 5 degree Right  valgus alignment guide is placed into the femoral canal and the distal femoral cutting block is pinned to remove 9 mm off the distal femur. Resection is made with an oscillating saw.      The tibia is subluxed forward and the menisci are removed. The extramedullary alignment guide is placed referencing proximally at the medial aspect of the tibial tubercle and distally along the second metatarsal axis and tibial crest. The block is pinned to remove 71mm off the more deficient medial  side. Resection is made with an oscillating saw. Size 6is the most appropriate size for the tibia and the proximal tibia is prepared with the modular drill and keel punch for that size.      The femoral sizing guide is placed and size 6 is most appropriate. Rotation is marked off the epicondylar axis and confirmed by creating a rectangular flexion gap at 90 degrees. The size 6 cutting block is pinned in this rotation and the anterior, posterior and chamfer cuts are made with the oscillating saw. The intercondylar block is then placed and that cut is made.  Trial size 6 tibial component, trial size 6 posterior stabilized femur and a 10  mm posterior stabilized rotating platform insert trial is placed. Full extension is achieved with excellent varus/valgus and anterior/posterior balance throughout full range of motion. The patella is everted and thickness measured to be 22  mm. Free hand resection is taken to 12 mm, a 38 template is placed, lug  holes are drilled, trial patella is placed, and it tracks normally. Osteophytes are removed off the posterior femur with the trial in place. All trials are removed and the cut bone surfaces prepared with pulsatile lavage. Cement is mixed and once ready for implantation, the size 6 tibial implant, size  6 posterior stabilized femoral component, and the size 38 patella are cemented in place and the patella is held with the clamp. The trial insert is placed and the knee held in full extension. The Exparel (20 ml mixed with 60 ml saline) is injected into the extensor mechanism, posterior capsule, medial and lateral gutters and subcutaneous tissues.  All extruded cement is removed and once the cement is hard the permanent 10 mm posterior stabilized rotating platform insert is placed into the tibial tray.      The wound is copiously irrigated with saline solution and the extensor mechanism closed over a hemovac drain with #1 V-loc suture. The tourniquet is released for a total tourniquet time of 33  minutes. Flexion against gravity is 140 degrees and the patella tracks normally. Subcutaneous tissue is closed with 2.0 vicryl and subcuticular with running 4.0 Monocryl. The incision is cleaned and dried and steri-strips and a bulky sterile dressing are applied. The limb is placed into a knee immobilizer and the patient is awakened and transported to recovery in stable condition.      Please note that a surgical assistant was a medical necessity for this procedure in order to perform it in a safe and expeditious manner. Surgical assistant was necessary to retract the ligaments and vital neurovascular structures to prevent injury to them and also necessary for proper positioning of the limb to allow for anatomic placement of the prosthesis.   Dione Plover Analyssa Downs, MD    04/16/2018, 8:13 AM

## 2018-04-17 DIAGNOSIS — M1711 Unilateral primary osteoarthritis, right knee: Secondary | ICD-10-CM | POA: Diagnosis not present

## 2018-04-17 LAB — BASIC METABOLIC PANEL
ANION GAP: 6 (ref 5–15)
BUN: 13 mg/dL (ref 8–23)
CALCIUM: 8.8 mg/dL — AB (ref 8.9–10.3)
CO2: 30 mmol/L (ref 22–32)
Chloride: 104 mmol/L (ref 98–111)
Creatinine, Ser: 0.6 mg/dL (ref 0.44–1.00)
GFR calc Af Amer: >60 mL/min (ref >60)
Glucose, Bld: 105 mg/dL — ABNORMAL HIGH (ref 70–99)
POTASSIUM: 3.8 mmol/L (ref 3.5–5.1)
SODIUM: 140 mmol/L (ref 135–145)

## 2018-04-17 LAB — CBC
HCT: 34 % — ABNORMAL LOW (ref 36.0–46.0)
Hemoglobin: 11.1 g/dL — ABNORMAL LOW (ref 12.0–15.0)
MCH: 31.9 pg (ref 26.0–34.0)
MCHC: 32.6 g/dL (ref 30.0–36.0)
MCV: 97.7 fL (ref 78.0–100.0)
PLATELETS: 242 10*3/uL (ref 150–400)
RBC: 3.48 MIL/uL — AB (ref 3.87–5.11)
RDW: 13.8 % (ref 11.5–15.5)
WBC: 8.9 10*3/uL (ref 4.0–10.5)

## 2018-04-17 MED ORDER — ASPIRIN 325 MG PO TBEC: 325.0000 mg | DELAYED_RELEASE_TABLET | Freq: Two times a day (BID) | ORAL | 0 refills | Status: DC

## 2018-04-17 MED ORDER — OXYCODONE HCL 5 MG PO TABS: 5.0000 mg | ORAL_TABLET | Freq: Four times a day (QID) | ORAL | 0 refills | Status: DC | PRN

## 2018-04-17 MED ORDER — TRAMADOL HCL 50 MG PO TABS: 50.0000 mg | ORAL_TABLET | Freq: Four times a day (QID) | ORAL | 0 refills | Status: DC | PRN

## 2018-04-17 MED ORDER — METHOCARBAMOL 500 MG PO TABS: 500.0000 mg | ORAL_TABLET | Freq: Four times a day (QID) | ORAL | 0 refills | Status: DC | PRN

## 2018-04-17 MED ORDER — GABAPENTIN 300 MG PO CAPS: 300.0000 mg | ORAL_CAPSULE | Freq: Three times a day (TID) | ORAL | 0 refills | Status: DC

## 2018-04-17 NOTE — Evaluation (Signed)
Physical Therapy Evaluation Patient Details Name: Vanessa Larsen MRN: [id number] DOB: 11/15/1945 Today's Date: 04/17/2018   History of Present Illness  Pt is a 72 year old female s/p R TKA and hx of vasovagal syncope  Clinical Impression  Pt is s/p TKA resulting in the deficits listed below (see PT Problem List).  Pt will benefit from skilled PT to increase their independence and safety with mobility to allow discharge to the venue listed below.  Pt assisted with ambulating in hallway and reported dizziness. BP 55/33 mmHg so pt reclined and RN notified.       Follow Up Recommendations Home health PT;Supervision for mobility/OOB;Follow surgeon's recommendation for DC plan and follow-up therapies    Equipment Recommendations  Rolling walker with 5" wheels;3in1 (PT)    Recommendations for Other Services       Precautions / Restrictions Precautions Precautions: Fall;Knee Required Braces or Orthoses: Knee Immobilizer - Right Restrictions Other Position/Activity Restrictions: WBAT      Mobility  Bed Mobility Overal bed mobility: Needs Assistance Bed Mobility: Supine to Sit     Supine to sit: Min guard;HOB elevated     General bed mobility comments: verbal cues for technique  Transfers Overall transfer level: Needs assistance Equipment used: Rolling walker (2 wheeled) Transfers: Sit to/from Stand Sit to Stand: Min assist         General transfer comment: verbal cues for UE and LE positioning  Ambulation/Gait Ambulation/Gait assistance: Min assist Gait Distance (Feet): 28 Feet Assistive device: Rolling walker (2 wheeled) Gait Pattern/deviations: Step-to pattern;Decreased stance time - right;Antalgic     General Gait Details: verbal cues for sequence, RW positioning, step length, pt reported dizziness and chair brought behind pt, dizziness did not resolve, pt assisted to recliner; BP 55/33 mmHg RN immediately notified  Stairs            Wheelchair Mobility     Modified Rankin (Stroke Patients Only)       Balance                                             Pertinent Vitals/Pain Pain Assessment: 0-10 Pain Score: 3  Pain Location: R knee Pain Descriptors / Indicators: Sore;Aching Pain Intervention(s): Limited activity within patient's tolerance;Repositioned;Monitored during session;Ice applied    Home Living Family/patient expects to be discharged to:: Private residence Living Arrangements: Spouse/significant other Available Help at Discharge: Family Type of Home: House Home Access: Stairs to enter Entrance Stairs-Rails: None Technical brewer of Steps: 2 Home Layout: Two level Home Equipment: None      Prior Function Level of Independence: Independent               Hand Dominance        Extremity/Trunk Assessment        Lower Extremity Assessment Lower Extremity Assessment: RLE deficits/detail RLE Deficits / Details: unable to perform SLR, ROM TBA       Communication   Communication: No difficulties  Cognition Arousal/Alertness: Awake/alert Behavior During Therapy: WFL for tasks assessed/performed Overall Cognitive Status: Within Functional Limits for tasks assessed                                        General Comments      Exercises  Assessment/Plan    PT Assessment Patient needs continued PT services  PT Problem List Decreased strength;Decreased range of motion;Decreased mobility;Decreased activity tolerance;Decreased knowledge of use of DME;Pain;Decreased knowledge of precautions       PT Treatment Interventions Gait training;Therapeutic activities;Therapeutic exercise;DME instruction;Functional mobility training;Patient/family education;Stair training    PT Goals (Current goals can be found in the Care Plan section)  Acute Rehab PT Goals PT Goal Formulation: With patient Time For Goal Achievement: 04/21/18 Potential to Achieve Goals: Good     Frequency 7X/week   Barriers to discharge        Co-evaluation               AM-PAC PT "6 Clicks" Daily Activity  Outcome Measure Difficulty turning over in bed (including adjusting bedclothes, sheets and blankets)?: A Little Difficulty moving from lying on back to sitting on the side of the bed? : A Lot Difficulty sitting down on and standing up from a chair with arms (e.g., wheelchair, bedside commode, etc,.)?: Unable Help needed moving to and from a bed to chair (including a wheelchair)?: A Little Help needed walking in hospital room?: A Little Help needed climbing 3-5 steps with a railing? : A Lot 6 Click Score: 14    End of Session Equipment Utilized During Treatment: Gait belt;Right knee immobilizer Activity Tolerance: Other (comment)(limited by low BP) Patient left: in chair;with chair alarm set;with call bell/phone within reach Nurse Communication: Mobility status(low BP) PT Visit Diagnosis: Other abnormalities of gait and mobility (R26.89)    Time: 8110-3159 PT Time Calculation (min) (ACUTE ONLY): 17 min   Charges:   PT Evaluation $PT Eval Low Complexity: 1 Low     PT G CodesCarmelia Bake, PT, DPT 04/17/2018 Pager: [phone]  York Ram E 04/17/2018, 12:49 PM

## 2018-04-17 NOTE — Progress Notes (Signed)
   Subjective: 1 Day Post-Op Procedure(s) (LRB): RIGHT TOTAL KNEE ARTHROPLASTY (Right) Patient reports pain as mild.   Patient seen in rounds with Dr. Wynelle Link. Patient is well, and has had no acute complaints or problems. She had some issues with nausea and vomiting yesterday but resolved overnight. No SOB or chest pain. Catheter removed this morning.  We will start therapy today.  Plan is to go Home after hospital stay.  Objective: Vital signs in last 24 hours: Temp:  [97.4 F (36.3 C)-98.4 F (36.9 C)] 98.3 F (36.8 C) (07/16 0524) Pulse Rate:  [70-87] 70 (07/16 0524) Resp:  [14-20] 16 (07/16 0524) BP: (100-143)/(60-85) 125/63 (07/16 0524) SpO2:  [96 %-100 %] 100 % (07/16 0524)  Intake/Output from previous day:  Intake/Output Summary (Last 24 hours) at 04/17/2018 0647 Last data filed at 04/17/2018 0600 Gross per 24 hour  Intake 3641.25 ml  Output 3415 ml  Net 226.25 ml    Intake/Output this shift: Total I/O In: 916.3 [P.O.:320; I.V.:596.3] Out: 925 [Urine:850; Drains:75]  Labs: Recent Labs    04/17/18 0551  HGB 11.1*   Recent Labs    04/17/18 0551  WBC 8.9  RBC 3.48*  HCT 34.0*  PLT 242   Recent Labs    04/17/18 0551  NA 140  K 3.8  CL 104  CO2 30  BUN 13  CREATININE 0.60  GLUCOSE 105*  CALCIUM 8.8*   EXAM General - Patient is Alert and Oriented Extremity - Neurologically intact Intact pulses distally Dorsiflexion/Plantar flexion intact Compartment soft Dressing - dressing C/D/I Motor Function - intact, moving foot and toes well on exam.  Hemovac pulled without difficulty.  Past Medical History:  Diagnosis Date  . Anal fissure   . Arthritis   . Elevated cholesterol   . Hyperplastic colon polyp   . Hypothyroidism   . IBS (irritable bowel syndrome)   . Internal hemorrhoids    Small  . Microscopic colitis   . Osteoarthritis   . Ovarian cyst, left   . PONV (postoperative nausea and vomiting)   . Vasovagal syncope    very easily, on  airplanes (trans atlanic), after sleeping    Assessment/Plan: 1 Day Post-Op Procedure(s) (LRB): RIGHT TOTAL KNEE ARTHROPLASTY (Right) Principal Problem:   OA (osteoarthritis) of knee  Anticipated LOS equal to or greater than 2 midnights due to - Age 71 and older with one or more of the following:  - Obesity  - Expected need for hospital services (PT, OT, Nursing) required for safe  discharge  - Anticipated need for postoperative skilled nursing care or inpatient rehab  - Active co-morbidities: None OR   - Unanticipated findings during/Post Surgery: None  - Patient is a high risk of re-admission due to: None  Estimated body mass index is 20.97 kg/m as calculated from the following:   Height as of this encounter: 5\' 9"  (1.753 m).   Weight as of this encounter: 64.4 kg (142 lb). Advance diet Up with therapy D/C IV fluids when tolerating POs well  DVT Prophylaxis - Aspirin Weight-Bearing as tolerated  D/C O2 and Pulse OX and try on Room Air  Will have her get up with therapy this morning and this afternoon. Possible DC home this afternoon if she is meeting goals. Will make sure that she continued to eat well and voiding on her own prior to DC.   Ardeen Jourdain, PA-C Orthopaedic Surgery 04/17/2018, 6:47 AM

## 2018-04-17 NOTE — Progress Notes (Signed)
Physical Therapy Treatment Patient Details Name: Vanessa Larsen MRN: [id number] DOB: 05/05/1946 Today's Date: 04/17/2018    History of Present Illness Pt is a 72 year old female s/p R TKA and hx of vasovagal syncope    PT Comments    Pt ambulated in hallway and performed LE exercises.  Pt took one seated rest break in hallway for safety since she was starting to feel dizzy.  Will need to practice steps prior to d/c.   Follow Up Recommendations  Home health PT;Supervision for mobility/OOB;Follow surgeon's recommendation for DC plan and follow-up therapies     Equipment Recommendations  Rolling walker with 5" wheels;3in1 (PT)    Recommendations for Other Services       Precautions / Restrictions Precautions Precautions: Fall;Knee Required Braces or Orthoses: Knee Immobilizer - Right Restrictions Other Position/Activity Restrictions: WBAT    Mobility  Bed Mobility Overal bed mobility: Needs Assistance Bed Mobility: Sit to Supine     Supine to sit: Min guard;HOB elevated Sit to supine: Min guard   General bed mobility comments: verbal cues for technique  Transfers Overall transfer level: Needs assistance Equipment used: Rolling walker (2 wheeled) Transfers: Sit to/from Stand Sit to Stand: Min guard         General transfer comment: verbal cues for UE and LE positioning  Ambulation/Gait Ambulation/Gait assistance: Min guard Gait Distance (Feet): 40 Feet(x2) Assistive device: Rolling walker (2 wheeled) Gait Pattern/deviations: Step-to pattern;Decreased stance time - right;Antalgic     General Gait Details: verbal cues for sequence, RW positioning, step length, pt took seated rest break as preventative measure (states she can feel her syncope coming) and able to ambulate back to room   Stairs             Wheelchair Mobility    Modified Rankin (Stroke Patients Only)       Balance                                             Cognition Arousal/Alertness: Awake/alert Behavior During Therapy: WFL for tasks assessed/performed Overall Cognitive Status: Within Functional Limits for tasks assessed                                        Exercises Total Joint Exercises Ankle Circles/Pumps: AROM;Both;10 reps Quad Sets: AROM;10 reps;Both Short Arc Quad: AROM;10 reps;Right Heel Slides: AAROM;10 reps;Right Hip ABduction/ADduction: AROM;10 reps;Right Straight Leg Raises: AROM;10 reps;Right Goniometric ROM: approximately 90* AAROM R knee flexion    General Comments        Pertinent Vitals/Pain Pain Assessment: 0-10 Pain Score: 3  Pain Location: R knee Pain Descriptors / Indicators: Sore;Aching Pain Intervention(s): Repositioned;Limited activity within patient's tolerance;Monitored during session    Home Living Family/patient expects to be discharged to:: Private residence Living Arrangements: Spouse/significant other Available Help at Discharge: Family Type of Home: House Home Access: Stairs to enter Entrance Stairs-Rails: None Home Layout: Two level Home Equipment: None      Prior Function Level of Independence: Independent          PT Goals (current goals can now be found in the care plan section) Acute Rehab PT Goals PT Goal Formulation: With patient Time For Goal Achievement: 04/21/18 Potential to Achieve Goals: Good Progress towards PT goals: Progressing toward goals  Frequency    7X/week      PT Plan Current plan remains appropriate    Co-evaluation              AM-PAC PT "6 Clicks" Daily Activity  Outcome Measure  Difficulty turning over in bed (including adjusting bedclothes, sheets and blankets)?: A Little Difficulty moving from lying on back to sitting on the side of the bed? : A Lot Difficulty sitting down on and standing up from a chair with arms (e.g., wheelchair, bedside commode, etc,.)?: A Lot Help needed moving to and from a bed to chair  (including a wheelchair)?: A Little Help needed walking in hospital room?: A Little Help needed climbing 3-5 steps with a railing? : A Little 6 Click Score: 16    End of Session Equipment Utilized During Treatment: Gait belt;Right knee immobilizer Activity Tolerance: Patient tolerated treatment well Patient left: with call bell/phone within reach;with family/visitor present;in bed Nurse Communication: Mobility status(low BP) PT Visit Diagnosis: Other abnormalities of gait and mobility (R26.89)     Time: 1340-1401 PT Time Calculation (min) (ACUTE ONLY): 21 min  Charges:  $Therapeutic Exercise: 8-22 mins                    G Codes:       Carmelia Bake, PT, DPT 04/17/2018 Pager: [phone]   York Ram E 04/17/2018, 3:33 PM

## 2018-04-17 NOTE — Progress Notes (Signed)
Spoke with patient and spouse at bedside. Confirmed plan for OP PT, already arranged. Needs a RW, contacted [place] to deliver to the room. Has borrow a 3n1. [phone]

## 2018-04-18 DIAGNOSIS — M1711 Unilateral primary osteoarthritis, right knee: Secondary | ICD-10-CM | POA: Diagnosis not present

## 2018-04-18 LAB — CBC
HCT: 32 % — ABNORMAL LOW (ref 36.0–46.0)
Hemoglobin: 10.6 g/dL — ABNORMAL LOW (ref 12.0–15.0)
MCH: 32 pg (ref 26.0–34.0)
MCHC: 33.1 g/dL (ref 30.0–36.0)
MCV: 96.7 fL (ref 78.0–100.0)
PLATELETS: 229 10*3/uL (ref 150–400)
RBC: 3.31 MIL/uL — AB (ref 3.87–5.11)
RDW: 13.8 % (ref 11.5–15.5)
WBC: 8.5 10*3/uL (ref 4.0–10.5)

## 2018-04-18 LAB — BASIC METABOLIC PANEL
Anion gap: 7 (ref 5–15)
BUN: 15 mg/dL (ref 8–23)
CALCIUM: 8.6 mg/dL — AB (ref 8.9–10.3)
CO2: 31 mmol/L (ref 22–32)
Chloride: 105 mmol/L (ref 98–111)
Creatinine, Ser: 0.57 mg/dL (ref 0.44–1.00)
GFR calc Af Amer: >60 mL/min (ref >60)
Glucose, Bld: 98 mg/dL (ref 70–99)
POTASSIUM: 3.5 mmol/L (ref 3.5–5.1)
SODIUM: 143 mmol/L (ref 135–145)

## 2018-04-18 NOTE — Progress Notes (Signed)
Physical Therapy Treatment Patient Details Name: Vanessa Larsen MRN: [id number] DOB: [birth year] Today's Date: 04/18/2018    History of Present Illness Pt is a 72 year old female s/p R TKA and hx of vasovagal syncope    PT Comments    Pt ambulated short distance to practice steps with spouse assisting.  Pt provided with stair handout.  Pt continues to report dizziness with mobility (see below).  Pt and spouse educated on safety upon d/c home and provided with gait belt.    Follow Up Recommendations  Home health PT;Supervision for mobility/OOB;Follow surgeon's recommendation for DC plan and follow-up therapies     Equipment Recommendations  Rolling walker with 5" wheels;3in1 (PT)    Recommendations for Other Services       Precautions / Restrictions Precautions Precautions: Fall;Knee Required Braces or Orthoses: Knee Immobilizer - Right Restrictions Other Position/Activity Restrictions: WBAT    Mobility  Bed Mobility Overal bed mobility: Modified Independent Bed Mobility: Supine to Sit     Supine to sit: Supervision        Transfers Overall transfer level: Needs assistance Equipment used: Rolling walker (2 wheeled) Transfers: Sit to/from Stand Sit to Stand: Supervision         General transfer comment: verbal cues for UE and LE positioning  Ambulation/Gait Ambulation/Gait assistance: Min guard Gait Distance (Feet): 20 Feet Assistive device: Rolling walker (2 wheeled) Gait Pattern/deviations: Step-to pattern;Decreased stance time - right;Antalgic     General Gait Details: pt ambulated to/from stairs and had dizziness; BP 74/37 mmHg upon sitting in recliner; BP 125/68 mmHg after a couple minutes reclined  (RN notified)   Stairs Stairs: Yes Stairs assistance: Min guard Stair Management: Step to pattern;Backwards;With walker Number of Stairs: 2 General stair comments: verbal cues for sequence, safety, RW positioning, spouse present and assisted with RW;  provided stair handout; recommended having a chair ready at the top of steps for pt to sit down in case dizzy upon performing at home   Wheelchair Mobility    Modified Rankin (Stroke Patients Only)       Balance                                            Cognition Arousal/Alertness: Awake/alert Behavior During Therapy: WFL for tasks assessed/performed Overall Cognitive Status: Within Functional Limits for tasks assessed                                        Exercises     General Comments        Pertinent Vitals/Pain Pain Assessment: 0-10 Pain Score: 4  Pain Location: R knee Pain Descriptors / Indicators: Sore;Aching Pain Intervention(s): Limited activity within patient's tolerance;Repositioned;Monitored during session    Home Living                      Prior Function            PT Goals (current goals can now be found in the care plan section) Progress towards PT goals: Progressing toward goals    Frequency    7X/week      PT Plan Current plan remains appropriate    Co-evaluation              AM-PAC PT "6 Clicks"  Daily Activity  Outcome Measure  Difficulty turning over in bed (including adjusting bedclothes, sheets and blankets)?: A Little Difficulty moving from lying on back to sitting on the side of the bed? : A Little Difficulty sitting down on and standing up from a chair with arms (e.g., wheelchair, bedside commode, etc,.)?: A Little Help needed moving to and from a bed to chair (including a wheelchair)?: A Little Help needed walking in hospital room?: A Little Help needed climbing 3-5 steps with a railing? : A Little 6 Click Score: 18    End of Session Equipment Utilized During Treatment: Gait belt Activity Tolerance: Other (comment)(limited by dizziness) Patient left: in chair;with call bell/phone within reach;with family/visitor present Nurse Communication: Mobility status PT Visit  Diagnosis: Other abnormalities of gait and mobility (R26.89)     Time: 8875-7972 PT Time Calculation (min) (ACUTE ONLY): 13 min  Charges:  $Gait Training: 8-22 mins                     G Codes:      Carmelia Bake, PT, DPT 04/18/2018 Pager: [phone]  York Ram E 04/18/2018, 2:54 PM

## 2018-04-18 NOTE — Progress Notes (Signed)
   Subjective: 2 Days Post-Op Procedure(s) (LRB): RIGHT TOTAL KNEE ARTHROPLASTY (Right) Patient reports pain as mild.   Patient seen in rounds with Dr. Wynelle Link. Patient is well, and has had no acute complaints or problems since early yesterday. She had some problems with dizziness early with therapy yesterday but has been feeling ok since. No SOB or chest pain. Reports flatus. Voiding well.    Objective: Vital signs in last 24 hours: Temp:  [98.4 F (36.9 C)-98.7 F (37.1 C)] 98.7 F (37.1 C) (07/17 0521) Pulse Rate:  [70-90] 70 (07/17 0919) Resp:  [14-16] 16 (07/17 0521) BP: (93-133)/(54-75) 93/54 (07/17 0919) SpO2:  [96 %-99 %] 96 % (07/17 0521)  Intake/Output from previous day:  Intake/Output Summary (Last 24 hours) at 04/18/2018 1110 Last data filed at 04/18/2018 0912 Gross per 24 hour  Intake 1391.25 ml  Output 1400 ml  Net -8.75 ml    Intake/Output this shift: Total I/O In: 240 [P.O.:240] Out: -   Labs: Recent Labs    04/17/18 0551 04/18/18 0543  HGB 11.1* 10.6*   Recent Labs    04/17/18 0551 04/18/18 0543  WBC 8.9 8.5  RBC 3.48* 3.31*  HCT 34.0* 32.0*  PLT 242 229   Recent Labs    04/17/18 0551 04/18/18 0543  NA 140 143  K 3.8 3.5  CL 104 105  CO2 30 31  BUN 13 15  CREATININE 0.60 0.57  GLUCOSE 105* 98  CALCIUM 8.8* 8.6*    EXAM General - Patient is Alert and Oriented Extremity - Neurologically intact Intact pulses distally Dorsiflexion/Plantar flexion intact No cellulitis present Compartment soft Dressing/Incision - clean, dry, no drainage Motor Function - intact, moving foot and toes well on exam.   Past Medical History:  Diagnosis Date  . Anal fissure   . Arthritis   . Elevated cholesterol   . Hyperplastic colon polyp   . Hypothyroidism   . IBS (irritable bowel syndrome)   . Internal hemorrhoids    Small  . Microscopic colitis   . Osteoarthritis   . Ovarian cyst, left   . PONV (postoperative nausea and vomiting)   .  Vasovagal syncope    very easily, on airplanes (trans atlanic), after sleeping    Assessment/Plan: 2 Days Post-Op Procedure(s) (LRB): RIGHT TOTAL KNEE ARTHROPLASTY (Right) Principal Problem:   OA (osteoarthritis) of knee  Estimated body mass index is 20.97 kg/m as calculated from the following:   Height as of this encounter: 5\' 9"  (1.753 m).   Weight as of this encounter: 64.4 kg (142 lb). Advance diet Up with therapy  DVT Prophylaxis - Aspirin Weight-Bearing as tolerated   Plan for DC home today if she is meeting goals. Follow up in office in 2 weeks. Discharge instructions given.   Ardeen Jourdain, PA-C Orthopaedic Surgery 04/18/2018, 11:10 AM

## 2018-04-18 NOTE — Discharge Summary (Signed)
Physician Discharge Summary   Patient ID: Vanessa Larsen MRN: [id number] DOB/AGE: January 16, 1946 72 y.o.  Admit date: 04/16/2018 Discharge date: 04/18/2018  Primary Diagnosis: Primary osteoarthritis right knee  Admission Diagnoses:  Past Medical History:  Diagnosis Date  . Anal fissure   . Arthritis   . Elevated cholesterol   . Hyperplastic colon polyp   . Hypothyroidism   . IBS (irritable bowel syndrome)   . Internal hemorrhoids    Small  . Microscopic colitis   . Osteoarthritis   . Ovarian cyst, left   . PONV (postoperative nausea and vomiting)   . Vasovagal syncope    very easily, on airplanes (trans atlanic), after sleeping   Discharge Diagnoses:   Principal Problem:   OA (osteoarthritis) of knee  Estimated body mass index is 20.97 kg/m as calculated from the following:   Height as of this encounter: 5' 9" (1.753 m).   Weight as of this encounter: 64.4 kg (142 lb).  Procedure:  Procedure(s) (LRB): RIGHT TOTAL KNEE ARTHROPLASTY (Right)   Consults: None  HPI: Vanessa Larsen, 72 y.o. female, has a history of pain and functional disability in the right knee due to arthritis and has failed non-surgical conservative treatments for greater than 12 weeks to includeNSAID's and/or analgesics, corticosteriod injections, flexibility and strengthening excercises and activity modification.  Onset of symptoms was gradual, starting 5 years ago with gradually worsening course since that time. The patient noted no past surgery on the right knee(s).  Patient currently rates pain in the right knee(s) at 7 out of 10 with activity. Patient has night pain, worsening of pain with activity and weight bearing, pain that interferes with activities of daily living, pain with passive range of motion, crepitus and joint swelling.  Patient has evidence of periarticular osteophytes and joint space narrowing by imaging studies.  There is no active infection.     Laboratory Data: Admission on  04/16/2018, Discharged on 04/18/2018  Component Date Value Ref Range Status  . WBC 04/17/2018 8.9  4.0 - 10.5 K/uL Final  . RBC 04/17/2018 3.48* 3.87 - 5.11 MIL/uL Final  . Hemoglobin 04/17/2018 11.1* 12.0 - 15.0 g/dL Final  . HCT 04/17/2018 34.0* 36.0 - 46.0 % Final  . MCV 04/17/2018 97.7  78.0 - 100.0 fL Final  . MCH 04/17/2018 31.9  26.0 - 34.0 pg Final  . MCHC 04/17/2018 32.6  30.0 - 36.0 g/dL Final  . RDW 04/17/2018 13.8  11.5 - 15.5 % Final  . Platelets 04/17/2018 242  150 - 400 K/uL Final   Performed at [facility], [place] [address] [place], [place] [address]  . Sodium 04/17/2018 140  135 - 145 mmol/L Final  . Potassium 04/17/2018 3.8  3.5 - 5.1 mmol/L Final  . Chloride 04/17/2018 104  98 - 111 mmol/L Final   Please note change in reference range.  . CO2 04/17/2018 30  22 - 32 mmol/L Final  . Glucose, Bld 04/17/2018 105* 70 - 99 mg/dL Final   Please note change in reference range.  . BUN 04/17/2018 13  8 - 23 mg/dL Final   Please note change in reference range.  . Creatinine, Ser 04/17/2018 0.60  0.44 - 1.00 mg/dL Final  . Calcium 04/17/2018 8.8* 8.9 - 10.3 mg/dL Final  . GFR calc non Af Amer 04/17/2018 >60  >60 mL/min Final  . GFR calc Af Amer 04/17/2018 >60  >60 mL/min Final   Comment: (NOTE) The eGFR has been calculated using the CKD  EPI equation. This calculation has not been validated in all clinical situations. eGFR's persistently <60 mL/min signify possible Chronic Kidney Disease.   Georgiann Hahn gap 04/17/2018 6  5 - 15 Final   Performed at [facility], [place] [address], [place], [place] [address]  . WBC 04/18/2018 8.5  4.0 - 10.5 K/uL Final  . RBC 04/18/2018 3.31* 3.87 - 5.11 MIL/uL Final  . Hemoglobin 04/18/2018 10.6* 12.0 - 15.0 g/dL Final  . HCT 04/18/2018 32.0* 36.0 - 46.0 % Final  . MCV 04/18/2018 96.7  78.0 - 100.0 fL Final  . MCH 04/18/2018 32.0  26.0 - 34.0 pg Final  . MCHC 04/18/2018 33.1  30.0 - 36.0 g/dL Final  . RDW  04/18/2018 13.8  11.5 - 15.5 % Final  . Platelets 04/18/2018 229  150 - 400 K/uL Final   Performed at [facility], [place] [address], [place], [place] [address]  . Sodium 04/18/2018 143  135 - 145 mmol/L Final  . Potassium 04/18/2018 3.5  3.5 - 5.1 mmol/L Final  . Chloride 04/18/2018 105  98 - 111 mmol/L Final   Please note change in reference range.  . CO2 04/18/2018 31  22 - 32 mmol/L Final  . Glucose, Bld 04/18/2018 98  70 - 99 mg/dL Final   Please note change in reference range.  . BUN 04/18/2018 15  8 - 23 mg/dL Final   Please note change in reference range.  . Creatinine, Ser 04/18/2018 0.57  0.44 - 1.00 mg/dL Final  . Calcium 04/18/2018 8.6* 8.9 - 10.3 mg/dL Final  . GFR calc non Af Amer 04/18/2018 >60  >60 mL/min Final  . GFR calc Af Amer 04/18/2018 >60  >60 mL/min Final   Comment: (NOTE) The eGFR has been calculated using the CKD EPI equation. This calculation has not been validated in all clinical situations. eGFR's persistently <60 mL/min signify possible Chronic Kidney Disease.   Georgiann Hahn gap 04/18/2018 7  5 - 15 Final   Performed at [facility], [place] [address], [place], [place] [address]  Hospital Outpatient Visit on 04/13/2018  Component Date Value Ref Range Status  . aPTT 04/13/2018 31  24 - 36 seconds Final   Performed at [facility], [place] [address], [place], [place] [address]  . WBC 04/13/2018 5.4  4.0 - 10.5 K/uL Final  . RBC 04/13/2018 4.15  3.87 - 5.11 MIL/uL Final  . Hemoglobin 04/13/2018 13.2  12.0 - 15.0 g/dL Final  . HCT 04/13/2018 39.7  36.0 - 46.0 % Final  . MCV 04/13/2018 95.7  78.0 - 100.0 fL Final  . MCH 04/13/2018 31.8  26.0 - 34.0 pg Final  . MCHC 04/13/2018 33.2  30.0 - 36.0 g/dL Final  . RDW 04/13/2018 13.6  11.5 - 15.5 % Final  . Platelets 04/13/2018 267  150 - 400 K/uL Final   Performed at [facility], [place] [address], [place], [place] [address]  . Sodium 04/13/2018 142   135 - 145 mmol/L Final  . Potassium 04/13/2018 4.0  3.5 - 5.1 mmol/L Final  . Chloride 04/13/2018 106  98 - 111 mmol/L Final   Please note change in reference range.  . CO2 04/13/2018 29  22 - 32 mmol/L Final  . Glucose, Bld 04/13/2018 113* 70 - 99 mg/dL Final   Please note change in reference range.  . BUN 04/13/2018 16  8 - 23 mg/dL Final   Please note change in  reference range.  . Creatinine, Ser 04/13/2018 0.58  0.44 - 1.00 mg/dL Final  . Calcium 04/13/2018 9.3  8.9 - 10.3 mg/dL Final  . Total Protein 04/13/2018 7.0  6.5 - 8.1 g/dL Final  . Albumin 04/13/2018 4.3  3.5 - 5.0 g/dL Final  . AST 04/13/2018 28  15 - 41 U/L Final  . ALT 04/13/2018 24  0 - 44 U/L Final   Please note change in reference range.  . Alkaline Phosphatase 04/13/2018 48  38 - 126 U/L Final  . Total Bilirubin 04/13/2018 0.5  0.3 - 1.2 mg/dL Final  . GFR calc non Af Amer 04/13/2018 >60  >60 mL/min Final  . GFR calc Af Amer 04/13/2018 >60  >60 mL/min Final   Comment: (NOTE) The eGFR has been calculated using the CKD EPI equation. This calculation has not been validated in all clinical situations. eGFR's persistently <60 mL/min signify possible Chronic Kidney Disease.   Georgiann Hahn gap 04/13/2018 7  5 - 15 Final   Performed at [facility], [place] [address], [place], [place] [address]  . Prothrombin Time 04/13/2018 12.8  11.4 - 15.2 seconds Final  . INR 04/13/2018 0.97   Final   Performed at [facility], [place] [address] [place], [place] [address]  . ABO/RH(D) 04/13/2018 O POS   Final  . Antibody Screen 04/13/2018 NEG   Final  . Sample Expiration 04/13/2018 04/19/2018   Final  . Extend sample reason 04/13/2018    Final                   Value:NO TRANSFUSIONS OR PREGNANCY IN THE PAST 3 MONTHS Performed at [facility], [place] [address], [place], [place] [address]   . Color, Urine 04/13/2018 YELLOW  YELLOW Final  . APPearance 04/13/2018 CLEAR  CLEAR Final  .  Specific Gravity, Urine 04/13/2018 1.013  1.005 - 1.030 Final  . pH 04/13/2018 8.0  5.0 - 8.0 Final  . Glucose, UA 04/13/2018 NEGATIVE  NEGATIVE mg/dL Final  . Hgb urine dipstick 04/13/2018 NEGATIVE  NEGATIVE Final  . Bilirubin Urine 04/13/2018 NEGATIVE  NEGATIVE Final  . Ketones, ur 04/13/2018 NEGATIVE  NEGATIVE mg/dL Final  . Protein, ur 04/13/2018 NEGATIVE  NEGATIVE mg/dL Final  . Nitrite 04/13/2018 NEGATIVE  NEGATIVE Final  . Leukocytes, UA 04/13/2018 NEGATIVE  NEGATIVE Final   Performed at [place] [address], [place], [place] [address]  . MRSA, PCR 04/13/2018 NEGATIVE  NEGATIVE Final  . Staphylococcus aureus 04/13/2018 NEGATIVE  NEGATIVE Final   Comment: (NOTE) The Xpert SA Assay (FDA approved for NASAL specimens in patients 7 years of age and older), is one component of a comprehensive surveillance program. It is not intended to diagnose infection nor to guide or monitor treatment. Performed at [facility], [place] [address], [place], [place] [address]   . ABO/RH(D) 04/13/2018    Final                   Value:O POS Performed at [facility], [place] [address] [place], [place] [address]       Hospital Course: Vanessa Larsen is a 72 y.o. who was admitted to [facility]. They were brought to the operating room on 04/16/2018 and underwent Procedure(s): RIGHT TOTAL KNEE ARTHROPLASTY.  Patient tolerated the procedure well and was later transferred to the recovery room and then to the orthopaedic floor for postoperative care.  They were  given PO and IV analgesics for pain control following their surgery.  They were given 24 hours of postoperative antibiotics of  Anti-infectives (From admission, onward)   Start     Dose/Rate Route Frequency Ordered Stop   04/16/18 1300  ceFAZolin (ANCEF) IVPB 2g/100 mL premix     2 g 200 mL/hr over 30 Minutes Intravenous Every 6 hours 04/16/18 0939 04/16/18 1919   04/16/18 0600   ceFAZolin (ANCEF) IVPB 2g/100 mL premix     2 g 200 mL/hr over 30 Minutes Intravenous On call to O.R. 04/16/18 5681 04/16/18 0721     and started on DVT prophylaxis in the form of Aspirin.   PT and OT were ordered for total joint protocol.  Discharge planning consulted to help with postop disposition and equipment needs.  Patient had a good night on the evening of surgery.  They started to get up OOB with therapy on day one. Hemovac drain was pulled without difficulty.  She had some issues with dizziness after he first session of therapy post op day one but improved as the day went on. Continued to work with therapy into day two.  Dressing was changed on day two and the incision was clean and dry.  The patient had progressed with therapy and meeting their goals.  Incision was healing well.  Patient was seen in rounds and was ready to go home.   Diet: Cardiac diet Activity:WBAT Follow-up:in 2 weeks Disposition - Home Discharged Condition: stable   Discharge Instructions    Call MD / Call 911   Complete by:  As directed    If you experience chest pain or shortness of breath, CALL 911 and be transported to the hospital emergency room.  If you develope a fever above 101 F, pus (white drainage) or increased drainage or redness at the wound, or calf pain, call your surgeon's office.   Constipation Prevention   Complete by:  As directed    Drink plenty of fluids.  Prune juice may be helpful.  You may use a stool softener, such as Colace (over the counter) 100 mg twice a day.  Use MiraLax (over the counter) for constipation as needed.   Diet - low sodium heart healthy   Complete by:  As directed    Discharge instructions   Complete by:  As directed    Dr. Gaynelle Arabian Total Joint Specialist [facility] [address] [place], [place] [address] [phone]  TOTAL KNEE REPLACEMENT POSTOPERATIVE DIRECTIONS  Knee Rehabilitation, Guidelines Following Surgery  Results after knee  surgery are often greatly improved when you follow the exercise, range of motion and muscle strengthening exercises prescribed by your doctor. Safety measures are also important to protect the knee from further injury. Any time any of these exercises cause you to have increased pain or swelling in your knee joint, decrease the amount until you are comfortable again and slowly increase them. If you have problems or questions, call your caregiver or physical therapist for advice.   HOME CARE INSTRUCTIONS  Remove items at home which could result in a fall. This includes throw rugs or furniture in walking pathways.  ICE to the affected knee every three hours for 30 minutes at a time and then as needed for pain and swelling.  Continue to use ice on the knee for pain and swelling from surgery. You may notice swelling that will progress down to the foot and ankle.  This is normal after surgery.  Elevate  the leg when you are not up walking on it.   Continue to use the breathing machine which will help keep your temperature down.  It is common for your temperature to cycle up and down following surgery, especially at night when you are not up moving around and exerting yourself.  The breathing machine keeps your lungs expanded and your temperature down. Do not place pillow under knee, focus on keeping the knee straight while resting  DIET You may resume your previous home diet once your are discharged from the hospital.  DRESSING / WOUND CARE / SHOWERING You may change your dressing every day with sterile gauze.  Please use good hand washing techniques before changing the dressing.  Do not use any lotions or creams on the incision until instructed by your surgeon. You may start showering once you are discharged home but do not submerge the incision under water. Just pat the incision dry and apply a dry gauze dressing on daily. Change the surgical dressing daily and reapply a dry dressing each  time.  ACTIVITY Walk with your walker as instructed. Use walker as long as suggested by your caregivers. Avoid periods of inactivity such as sitting longer than an hour when not asleep. This helps prevent blood clots.  You may resume a sexual relationship in one month or when given the OK by your doctor.  You may return to work once you are cleared by your doctor.  Do not drive a car for 6 weeks or until released by you surgeon.  Do not drive while taking narcotics.  WEIGHT BEARING Weight bearing as tolerated with assist device (walker, cane, etc) as directed, use it as long as suggested by your surgeon or therapist, typically at least 4-6 weeks.  POSTOPERATIVE CONSTIPATION PROTOCOL Constipation - defined medically as fewer than three stools per week and severe constipation as less than one stool per week.  One of the most common issues patients have following surgery is constipation.  Even if you have a regular bowel pattern at home, your normal regimen is likely to be disrupted due to multiple reasons following surgery.  Combination of anesthesia, postoperative narcotics, change in appetite and fluid intake all can affect your bowels.  In order to avoid complications following surgery, here are some recommendations in order to help you during your recovery period.  Colace (docusate) - Pick up an over-the-counter form of Colace or another stool softener and take twice a day as long as you are requiring postoperative pain medications.  Take with a full glass of water daily.  If you experience loose stools or diarrhea, hold the colace until you stool forms back up.  If your symptoms do not get better within 1 week or if they get worse, check with your doctor.  Dulcolax (bisacodyl) - Pick up over-the-counter and take as directed by the product packaging as needed to assist with the movement of your bowels.  Take with a full glass of water.  Use this product as needed if not relieved by Colace  only.   MiraLax (polyethylene glycol) - Pick up over-the-counter to have on hand.  MiraLax is a solution that will increase the amount of water in your bowels to assist with bowel movements.  Take as directed and can mix with a glass of water, juice, soda, coffee, or tea.  Take if you go more than two days without a movement. Do not use MiraLax more than once per day. Call your doctor if you are  still constipated or irregular after using this medication for 7 days in a row.  If you continue to have problems with postoperative constipation, please contact the office for further assistance and recommendations.  If you experience "the worst abdominal pain ever" or develop nausea or vomiting, please contact the office immediatly for further recommendations for treatment.  ITCHING  If you experience itching with your medications, try taking only a single pain pill, or even half a pain pill at a time.  You can also use Benadryl over the counter for itching or also to help with sleep.   TED HOSE STOCKINGS Wear the elastic stockings on both legs for three weeks following surgery during the day but you may remove then at night for sleeping.  MEDICATIONS See your medication summary on the "After Visit Summary" that the nursing staff will review with you prior to discharge.  You may have some home medications which will be placed on hold until you complete the course of blood thinner medication.  It is important for you to complete the blood thinner medication as prescribed by your surgeon.  Continue your approved medications as instructed at time of discharge.  PRECAUTIONS If you experience chest pain or shortness of breath - call 911 immediately for transfer to the hospital emergency department.  If you develop a fever greater that 101 F, purulent drainage from wound, increased redness or drainage from wound, foul odor from the wound/dressing, or calf pain - CONTACT YOUR SURGEON.                                                    FOLLOW-UP APPOINTMENTS Make sure you keep all of your appointments after your operation with your surgeon and caregivers. You should call the office at the above phone number and make an appointment for approximately two weeks after the date of your surgery or on the date instructed by your surgeon outlined in the "After Visit Summary".   RANGE OF MOTION AND STRENGTHENING EXERCISES  Rehabilitation of the knee is important following a knee injury or an operation. After just a few days of immobilization, the muscles of the thigh which control the knee become weakened and shrink (atrophy). Knee exercises are designed to build up the tone and strength of the thigh muscles and to improve knee motion. Often times heat used for twenty to thirty minutes before working out will loosen up your tissues and help with improving the range of motion but do not use heat for the first two weeks following surgery. These exercises can be done on a training (exercise) mat, on the floor, on a table or on a bed. Use what ever works the best and is most comfortable for you Knee exercises include:  Leg Lifts - While your knee is still immobilized in a splint or cast, you can do straight leg raises. Lift the leg to 60 degrees, hold for 3 sec, and slowly lower the leg. Repeat 10-20 times 2-3 times daily. Perform this exercise against resistance later as your knee gets better.  Quad and Hamstring Sets - Tighten up the muscle on the front of the thigh (Quad) and hold for 5-10 sec. Repeat this 10-20 times hourly. Hamstring sets are done by pushing the foot backward against an object and holding for 5-10 sec. Repeat as with quad sets.  Leg Slides: Lying on your back, slowly slide your foot toward your buttocks, bending your knee up off the floor (only go as far as is comfortable). Then slowly slide your foot back down until your leg is flat on the floor again. Angel Wings: Lying on your back spread your legs to  the side as far apart as you can without causing discomfort.  A rehabilitation program following serious knee injuries can speed recovery and prevent re-injury in the future due to weakened muscles. Contact your doctor or a physical therapist for more information on knee rehabilitation.   IF YOU ARE TRANSFERRED TO A SKILLED REHAB FACILITY If the patient is transferred to a skilled rehab facility following release from the hospital, a list of the current medications will be sent to the facility for the patient to continue.  When discharged from the skilled rehab facility, please have the facility set up the patient's [place] prior to being released. Also, the skilled facility will be responsible for providing the patient with their medications at time of release from the facility to include their pain medication, the muscle relaxants, and their blood thinner medication. If the patient is still at the rehab facility at time of the two week follow up appointment, the skilled rehab facility will also need to assist the patient in arranging follow up appointment in our office and any transportation needs.  MAKE SURE YOU:  Understand these instructions.  Get help right away if you are not doing well or get worse.    Pick up stool softner and laxative for home use following surgery while on pain medications. Do not submerge incision under water. Please use good hand washing techniques while changing dressing each day. May shower starting three days after surgery. Please use a clean towel to pat the incision dry following showers. Continue to use ice for pain and swelling after surgery. Do not use any lotions or creams on the incision until instructed by your surgeon.   Increase activity slowly as tolerated   Complete by:  As directed      Allergies as of 04/18/2018      Reactions   Fentanyl Nausea And Vomiting      Medication List    TAKE these medications   aspirin 325 MG  EC tablet Take 1 tablet (325 mg total) by mouth 2 (two) times daily. To prevent blood clots   atorvastatin 20 MG tablet Commonly known as:  LIPITOR Take 20 mg by mouth every evening.   CALCIUM 1000 + D PO Take 1 capsule by mouth daily.   gabapentin 300 MG capsule Commonly known as:  NEURONTIN Take 1 capsule (300 mg total) by mouth 3 (three) times daily.   levothyroxine 100 MCG tablet Commonly known as:  SYNTHROID, LEVOTHROID Take 100 mcg by mouth daily before breakfast.   methocarbamol 500 MG tablet Commonly known as:  ROBAXIN Take 1 tablet (500 mg total) by mouth every 6 (six) hours as needed for muscle spasms.   multivitamin with minerals tablet Take 1 tablet by mouth daily.   oxyCODONE 5 MG immediate release tablet Commonly known as:  Oxy IR/ROXICODONE Take 1-2 tablets (5-10 mg total) by mouth every 6 (six) hours as needed for moderate pain (pain score 4-6).   traMADol 50 MG tablet Commonly known as:  ULTRAM Take 1-2 tablets (50-100 mg total) by mouth every 6 (six) hours as needed for moderate pain (if oxycodone not effective).      Follow-up  Information    Aluisio, Pilar Plate, MD. Schedule an appointment as soon as possible for a visit on 05/01/2018.   Specialty:  Orthopedic Surgery Contact information: [address] [place] [address] [place] [address] [phone]        [facility] - Dme Follow up.   Why:  walker Contact information: [address] [place] [address] [phone]           Signed: Ardeen Jourdain, PA-C Orthopaedic Surgery 04/18/2018, 9:33 PM

## 2018-04-18 NOTE — Progress Notes (Signed)
Physical Therapy Treatment Patient Details Name: Vanessa Larsen MRN: [id number] DOB: [birth year] Today's Date: 04/18/2018    History of Present Illness Pt is a 72 year old female s/p R TKA and hx of vasovagal syncope    PT Comments    Pt ambulated in hallway however required seated rest breaks due to dizziness.  BP 93/54 mmHg and RN notified.     Follow Up Recommendations  Home health PT;Supervision for mobility/OOB;Follow surgeon's recommendation for DC plan and follow-up therapies     Equipment Recommendations  Rolling walker with 5" wheels;3in1 (PT)    Recommendations for Other Services       Precautions / Restrictions Precautions Precautions: Fall;Knee Required Braces or Orthoses: Knee Immobilizer - Right Restrictions Other Position/Activity Restrictions: WBAT    Mobility  Bed Mobility Overal bed mobility: Needs Assistance Bed Mobility: Supine to Sit     Supine to sit: Supervision        Transfers Overall transfer level: Needs assistance Equipment used: Rolling walker (2 wheeled) Transfers: Sit to/from Stand Sit to Stand: Min guard         General transfer comment: verbal cues for UE and LE positioning  Ambulation/Gait Ambulation/Gait assistance: Min guard Gait Distance (Feet): 20 Feet Assistive device: Rolling walker (2 wheeled) Gait Pattern/deviations: Step-to pattern;Decreased stance time - right;Antalgic     General Gait Details: verbal cues for sequence, RW positioning, step length, pt took seated rest break after 20 feet and then again after 15 feet (in preparation for stairs) however pt with dizziness so obtained BP 93/54 mmHg and RN aware; pt back to room in recliner chair     Stairs             Wheelchair Mobility    Modified Rankin (Stroke Patients Only)       Balance                                            Cognition Arousal/Alertness: Awake/alert Behavior During Therapy: WFL for tasks  assessed/performed Overall Cognitive Status: Within Functional Limits for tasks assessed                                        Exercises Total Joint Exercises Ankle Circles/Pumps: AROM;Both;10 reps Quad Sets: AROM;10 reps;Both Short Arc Quad: AROM;10 reps;Right Heel Slides: AAROM;10 reps;Seated;Right Hip ABduction/ADduction: AROM;10 reps;Right Straight Leg Raises: AROM;10 reps;Right    General Comments        Pertinent Vitals/Pain Pain Assessment: 0-10 Pain Score: 3  Pain Location: R knee Pain Descriptors / Indicators: Sore;Aching Pain Intervention(s): Limited activity within patient's tolerance;Repositioned;Monitored during session;Premedicated before session    Home Living                      Prior Function            PT Goals (current goals can now be found in the care plan section) Progress towards PT goals: Progressing toward goals    Frequency    7X/week      PT Plan Current plan remains appropriate    Co-evaluation              AM-PAC PT "6 Clicks" Daily Activity  Outcome Measure  Difficulty turning over in bed (including adjusting bedclothes, sheets  and blankets)?: A Little Difficulty moving from lying on back to sitting on the side of the bed? : A Little Difficulty sitting down on and standing up from a chair with arms (e.g., wheelchair, bedside commode, etc,.)?: A Little Help needed moving to and from a bed to chair (including a wheelchair)?: A Little Help needed walking in hospital room?: A Little Help needed climbing 3-5 steps with a railing? : A Little 6 Click Score: 18    End of Session Equipment Utilized During Treatment: Gait belt;Right knee immobilizer Activity Tolerance: Other (comment)(low BP) Patient left: in chair;with call bell/phone within reach;with family/visitor present Nurse Communication: Mobility status PT Visit Diagnosis: Other abnormalities of gait and mobility (R26.89)     Time:  5277-8242 PT Time Calculation (min) (ACUTE ONLY): 23 min  Charges:  $Gait Training: 8-22 mins $Therapeutic Exercise: 8-22 mins                    G Codes:       Carmelia Bake, PT, DPT 04/18/2018 Pager: [phone]  York Ram E 04/18/2018, 1:04 PM

## 2018-08-21 ENCOUNTER — Ambulatory Visit (INDEPENDENT_AMBULATORY_CARE_PROVIDER_SITE_OTHER): Payer: 59 | Admitting: Gynecology

## 2018-08-21 ENCOUNTER — Encounter: Payer: Self-pay | Admitting: Gynecology

## 2018-08-21 VITALS — BP 118/76 | Ht 69.0 in | Wt 140.0 lb

## 2018-08-21 DIAGNOSIS — N952 Postmenopausal atrophic vaginitis: Secondary | ICD-10-CM

## 2018-08-21 DIAGNOSIS — Z01419 Encounter for gynecological examination (general) (routine) without abnormal findings: Secondary | ICD-10-CM | POA: Diagnosis not present

## 2018-08-21 MED ORDER — CIPROFLOXACIN HCL 500 MG PO TABS: 500.0000 mg | ORAL_TABLET | Freq: Two times a day (BID) | ORAL | 0 refills | Status: DC

## 2018-08-21 MED ORDER — ONDANSETRON HCL 4 MG PO TABS: 4.0000 mg | ORAL_TABLET | Freq: Three times a day (TID) | ORAL | 0 refills | Status: DC | PRN

## 2018-08-21 NOTE — Progress Notes (Signed)
    Vanessa Larsen [birth year] [id number]        72 y.o.  [id number] for annual gynecologic exam.  Without gynecologic complaints.  Past medical history,surgical history, problem list, medications, allergies, family history and social history were all reviewed and documented as reviewed in the EPIC chart.  ROS:  Performed with pertinent positives and negatives included in the history, assessment and plan.   Additional significant findings : None   Exam: [name] assistant Vitals:   08/21/18 1215  BP: 118/76  Weight: 140 lb (63.5 kg)  Height: 5\' 9"  (1.753 m)   Body mass index is 20.67 kg/m.  General appearance:  Normal affect, orientation and appearance. Skin: Grossly normal HEENT: Without gross lesions.  No cervical or supraclavicular adenopathy. Thyroid normal.  Lungs:  Clear without wheezing, rales or rhonchi Cardiac: RR, without RMG Abdominal:  Soft, nontender, without masses, guarding, rebound, organomegaly or hernia Breasts:  Examined lying and sitting without masses, retractions, discharge or axillary adenopathy. Pelvic:  Ext, BUS, Vagina: With atrophic changes  Cervix: With atrophic changes  Uterus: Anteverted, normal size, shape and contour, midline and mobile nontender   Adnexa: Without masses or tenderness    Anus and perineum: Normal   Rectovaginal: Normal sphincter tone without palpated masses or tenderness.    Assessment/Plan:  72 y.o. G72P3003 female for annual gynecologic exam.   1. Postmenopausal/atrophic genital changes.  No significant menopausal symptoms or any bleeding. 2. Pap smear 2018.  No Pap smear done today.  History of cryosurgery a number years ago with reactive atypia secondary to endometriosis of the cervix.  No true dysplasia. 3. Mammography 01/2018.  Continue with annual mammography when due.  Breast exam normal today. 4. Colonoscopy 2018.  Repeat at their recommended interval. 5. DEXA 2015 normal.  Plan repeat DEXA next year at 5-year  interval. 6. Health maintenance.  No routine lab work done as patient does this elsewhere.  Traveling to [place].  Prescription for Zofran 4 mg #20 and ciprofloxacin 500 mg twice daily x7-day prescription provided to have in the event of GI infection, UTI or respiratory.  Follow-up in 1 year, sooner as needed   Anastasio Auerbach MD, 12:47 PM 08/21/2018

## 2018-08-21 NOTE — Patient Instructions (Signed)
Follow-up in 1 year for annual exam, sooner if any issues. 

## 2018-10-08 DIAGNOSIS — R531 Weakness: Secondary | ICD-10-CM | POA: Diagnosis not present

## 2018-10-08 DIAGNOSIS — Z96651 Presence of right artificial knee joint: Secondary | ICD-10-CM | POA: Diagnosis not present

## 2018-10-08 DIAGNOSIS — M25572 Pain in left ankle and joints of left foot: Secondary | ICD-10-CM | POA: Diagnosis not present

## 2018-10-08 DIAGNOSIS — M25661 Stiffness of right knee, not elsewhere classified: Secondary | ICD-10-CM | POA: Diagnosis not present

## 2018-10-10 DIAGNOSIS — M25661 Stiffness of right knee, not elsewhere classified: Secondary | ICD-10-CM | POA: Diagnosis not present

## 2018-10-10 DIAGNOSIS — Z96651 Presence of right artificial knee joint: Secondary | ICD-10-CM | POA: Diagnosis not present

## 2018-10-10 DIAGNOSIS — M25572 Pain in left ankle and joints of left foot: Secondary | ICD-10-CM | POA: Diagnosis not present

## 2018-10-10 DIAGNOSIS — R531 Weakness: Secondary | ICD-10-CM | POA: Diagnosis not present

## 2018-10-15 DIAGNOSIS — R531 Weakness: Secondary | ICD-10-CM | POA: Diagnosis not present

## 2018-10-15 DIAGNOSIS — M25661 Stiffness of right knee, not elsewhere classified: Secondary | ICD-10-CM | POA: Diagnosis not present

## 2018-10-15 DIAGNOSIS — M25572 Pain in left ankle and joints of left foot: Secondary | ICD-10-CM | POA: Diagnosis not present

## 2018-10-15 DIAGNOSIS — Z96651 Presence of right artificial knee joint: Secondary | ICD-10-CM | POA: Diagnosis not present

## 2018-10-18 DIAGNOSIS — R531 Weakness: Secondary | ICD-10-CM | POA: Diagnosis not present

## 2018-10-18 DIAGNOSIS — Z96651 Presence of right artificial knee joint: Secondary | ICD-10-CM | POA: Diagnosis not present

## 2018-10-18 DIAGNOSIS — M25661 Stiffness of right knee, not elsewhere classified: Secondary | ICD-10-CM | POA: Diagnosis not present

## 2018-10-18 DIAGNOSIS — M25572 Pain in left ankle and joints of left foot: Secondary | ICD-10-CM | POA: Diagnosis not present

## 2018-10-22 DIAGNOSIS — Z96651 Presence of right artificial knee joint: Secondary | ICD-10-CM | POA: Diagnosis not present

## 2018-10-22 DIAGNOSIS — M25661 Stiffness of right knee, not elsewhere classified: Secondary | ICD-10-CM | POA: Diagnosis not present

## 2018-10-22 DIAGNOSIS — M25572 Pain in left ankle and joints of left foot: Secondary | ICD-10-CM | POA: Diagnosis not present

## 2018-10-22 DIAGNOSIS — R531 Weakness: Secondary | ICD-10-CM | POA: Diagnosis not present

## 2018-10-24 DIAGNOSIS — R531 Weakness: Secondary | ICD-10-CM | POA: Diagnosis not present

## 2018-10-24 DIAGNOSIS — M25661 Stiffness of right knee, not elsewhere classified: Secondary | ICD-10-CM | POA: Diagnosis not present

## 2018-10-24 DIAGNOSIS — M25572 Pain in left ankle and joints of left foot: Secondary | ICD-10-CM | POA: Diagnosis not present

## 2018-10-24 DIAGNOSIS — Z96651 Presence of right artificial knee joint: Secondary | ICD-10-CM | POA: Diagnosis not present

## 2018-10-29 DIAGNOSIS — M25661 Stiffness of right knee, not elsewhere classified: Secondary | ICD-10-CM | POA: Diagnosis not present

## 2018-10-29 DIAGNOSIS — M25572 Pain in left ankle and joints of left foot: Secondary | ICD-10-CM | POA: Diagnosis not present

## 2018-10-29 DIAGNOSIS — R531 Weakness: Secondary | ICD-10-CM | POA: Diagnosis not present

## 2018-10-29 DIAGNOSIS — Z96651 Presence of right artificial knee joint: Secondary | ICD-10-CM | POA: Diagnosis not present

## 2018-10-30 DIAGNOSIS — S82832D Other fracture of upper and lower end of left fibula, subsequent encounter for closed fracture with routine healing: Secondary | ICD-10-CM | POA: Diagnosis not present

## 2018-10-31 DIAGNOSIS — M25661 Stiffness of right knee, not elsewhere classified: Secondary | ICD-10-CM | POA: Diagnosis not present

## 2018-10-31 DIAGNOSIS — M25572 Pain in left ankle and joints of left foot: Secondary | ICD-10-CM | POA: Diagnosis not present

## 2018-10-31 DIAGNOSIS — Z96651 Presence of right artificial knee joint: Secondary | ICD-10-CM | POA: Diagnosis not present

## 2018-10-31 DIAGNOSIS — R531 Weakness: Secondary | ICD-10-CM | POA: Diagnosis not present

## 2018-11-05 DIAGNOSIS — M25572 Pain in left ankle and joints of left foot: Secondary | ICD-10-CM | POA: Diagnosis not present

## 2018-11-05 DIAGNOSIS — M25661 Stiffness of right knee, not elsewhere classified: Secondary | ICD-10-CM | POA: Diagnosis not present

## 2018-11-05 DIAGNOSIS — R531 Weakness: Secondary | ICD-10-CM | POA: Diagnosis not present

## 2018-11-05 DIAGNOSIS — Z96651 Presence of right artificial knee joint: Secondary | ICD-10-CM | POA: Diagnosis not present

## 2018-11-12 DIAGNOSIS — Z96651 Presence of right artificial knee joint: Secondary | ICD-10-CM | POA: Diagnosis not present

## 2018-11-12 DIAGNOSIS — R531 Weakness: Secondary | ICD-10-CM | POA: Diagnosis not present

## 2018-11-12 DIAGNOSIS — M25572 Pain in left ankle and joints of left foot: Secondary | ICD-10-CM | POA: Diagnosis not present

## 2018-11-12 DIAGNOSIS — M25661 Stiffness of right knee, not elsewhere classified: Secondary | ICD-10-CM | POA: Diagnosis not present

## 2018-11-13 DIAGNOSIS — M25661 Stiffness of right knee, not elsewhere classified: Secondary | ICD-10-CM | POA: Diagnosis not present

## 2018-11-13 DIAGNOSIS — M25572 Pain in left ankle and joints of left foot: Secondary | ICD-10-CM | POA: Diagnosis not present

## 2018-11-13 DIAGNOSIS — R531 Weakness: Secondary | ICD-10-CM | POA: Diagnosis not present

## 2018-11-13 DIAGNOSIS — Z96651 Presence of right artificial knee joint: Secondary | ICD-10-CM | POA: Diagnosis not present

## 2018-11-19 DIAGNOSIS — R531 Weakness: Secondary | ICD-10-CM | POA: Diagnosis not present

## 2018-11-19 DIAGNOSIS — M25661 Stiffness of right knee, not elsewhere classified: Secondary | ICD-10-CM | POA: Diagnosis not present

## 2018-11-19 DIAGNOSIS — Z96651 Presence of right artificial knee joint: Secondary | ICD-10-CM | POA: Diagnosis not present

## 2018-11-19 DIAGNOSIS — M25572 Pain in left ankle and joints of left foot: Secondary | ICD-10-CM | POA: Diagnosis not present

## 2018-11-20 DIAGNOSIS — M25572 Pain in left ankle and joints of left foot: Secondary | ICD-10-CM | POA: Diagnosis not present

## 2018-11-20 DIAGNOSIS — M25661 Stiffness of right knee, not elsewhere classified: Secondary | ICD-10-CM | POA: Diagnosis not present

## 2018-11-20 DIAGNOSIS — Z96651 Presence of right artificial knee joint: Secondary | ICD-10-CM | POA: Diagnosis not present

## 2018-11-20 DIAGNOSIS — R531 Weakness: Secondary | ICD-10-CM | POA: Diagnosis not present

## 2018-11-27 DIAGNOSIS — M25661 Stiffness of right knee, not elsewhere classified: Secondary | ICD-10-CM | POA: Diagnosis not present

## 2018-11-27 DIAGNOSIS — R531 Weakness: Secondary | ICD-10-CM | POA: Diagnosis not present

## 2018-11-27 DIAGNOSIS — Z96651 Presence of right artificial knee joint: Secondary | ICD-10-CM | POA: Diagnosis not present

## 2018-11-27 DIAGNOSIS — M25572 Pain in left ankle and joints of left foot: Secondary | ICD-10-CM | POA: Diagnosis not present

## 2018-11-28 DIAGNOSIS — R531 Weakness: Secondary | ICD-10-CM | POA: Diagnosis not present

## 2018-11-28 DIAGNOSIS — M25661 Stiffness of right knee, not elsewhere classified: Secondary | ICD-10-CM | POA: Diagnosis not present

## 2018-11-28 DIAGNOSIS — M25572 Pain in left ankle and joints of left foot: Secondary | ICD-10-CM | POA: Diagnosis not present

## 2018-11-28 DIAGNOSIS — Z96651 Presence of right artificial knee joint: Secondary | ICD-10-CM | POA: Diagnosis not present

## 2018-12-04 DIAGNOSIS — Z96651 Presence of right artificial knee joint: Secondary | ICD-10-CM | POA: Diagnosis not present

## 2018-12-04 DIAGNOSIS — M25572 Pain in left ankle and joints of left foot: Secondary | ICD-10-CM | POA: Diagnosis not present

## 2018-12-04 DIAGNOSIS — R531 Weakness: Secondary | ICD-10-CM | POA: Diagnosis not present

## 2018-12-04 DIAGNOSIS — M25661 Stiffness of right knee, not elsewhere classified: Secondary | ICD-10-CM | POA: Diagnosis not present

## 2018-12-06 DIAGNOSIS — M25661 Stiffness of right knee, not elsewhere classified: Secondary | ICD-10-CM | POA: Diagnosis not present

## 2018-12-06 DIAGNOSIS — M25572 Pain in left ankle and joints of left foot: Secondary | ICD-10-CM | POA: Diagnosis not present

## 2018-12-06 DIAGNOSIS — R531 Weakness: Secondary | ICD-10-CM | POA: Diagnosis not present

## 2018-12-06 DIAGNOSIS — Z96651 Presence of right artificial knee joint: Secondary | ICD-10-CM | POA: Diagnosis not present

## 2018-12-10 DIAGNOSIS — M25572 Pain in left ankle and joints of left foot: Secondary | ICD-10-CM | POA: Diagnosis not present

## 2018-12-10 DIAGNOSIS — M25661 Stiffness of right knee, not elsewhere classified: Secondary | ICD-10-CM | POA: Diagnosis not present

## 2018-12-10 DIAGNOSIS — Z96651 Presence of right artificial knee joint: Secondary | ICD-10-CM | POA: Diagnosis not present

## 2018-12-10 DIAGNOSIS — R531 Weakness: Secondary | ICD-10-CM | POA: Diagnosis not present

## 2018-12-11 ENCOUNTER — Other Ambulatory Visit: Payer: Self-pay | Admitting: Internal Medicine

## 2018-12-11 DIAGNOSIS — Z1231 Encounter for screening mammogram for malignant neoplasm of breast: Secondary | ICD-10-CM

## 2018-12-11 DIAGNOSIS — S82832D Other fracture of upper and lower end of left fibula, subsequent encounter for closed fracture with routine healing: Secondary | ICD-10-CM | POA: Diagnosis not present

## 2018-12-11 DIAGNOSIS — M25661 Stiffness of right knee, not elsewhere classified: Secondary | ICD-10-CM | POA: Diagnosis not present

## 2018-12-11 DIAGNOSIS — R531 Weakness: Secondary | ICD-10-CM | POA: Diagnosis not present

## 2018-12-11 DIAGNOSIS — M25572 Pain in left ankle and joints of left foot: Secondary | ICD-10-CM | POA: Diagnosis not present

## 2018-12-11 DIAGNOSIS — Z96651 Presence of right artificial knee joint: Secondary | ICD-10-CM | POA: Diagnosis not present

## 2018-12-28 ENCOUNTER — Encounter: Payer: Self-pay | Admitting: *Deleted

## 2019-01-15 ENCOUNTER — Ambulatory Visit: Payer: 59

## 2019-02-11 ENCOUNTER — Encounter: Payer: Self-pay | Admitting: *Deleted

## 2019-02-11 ENCOUNTER — Other Ambulatory Visit: Payer: Self-pay | Admitting: *Deleted

## 2019-02-11 NOTE — Patient Outreach (Signed)
[facility] [place] [facility]) Care Management  02/11/2019  Vanessa Larsen [birth year] [id number]  Follow up from HRA screening phone call attempts placed to patient on 12/04/2018 and 12/28/2018- both calls were unsuccessful and patient was mailed unsuccessful letter on 12/28/2018 without patient call-back to date.  Patient to be followed in [facility] HRA engaged program.   Plan:  Will re-attempt call to patient within 3 months of initial unsuccessful attempts previously made  Oneta Rack, RN, BSN, [employer] Coordinator [facility] Care Management  [phone]

## 2019-02-27 ENCOUNTER — Other Ambulatory Visit: Payer: Self-pay | Admitting: *Deleted

## 2019-02-27 NOTE — Patient Outreach (Signed)
[facility] [facility]) Care Management  02/27/2019  ALICIANNA LITCHFORD 1946-01-10 [id number]   Telephone assessment complete according to health risk assessment through [facility].  Spoke with husband on 4/13, no needs identified.  [facility] information letter, pamphlet, and magnet sent to member.  Will follow up within the next 6 months.   Valente David, [place], MSN [place] [phone]

## 2019-03-19 DIAGNOSIS — E039 Hypothyroidism, unspecified: Secondary | ICD-10-CM | POA: Diagnosis not present

## 2019-03-19 DIAGNOSIS — R7301 Impaired fasting glucose: Secondary | ICD-10-CM | POA: Diagnosis not present

## 2019-03-19 DIAGNOSIS — E7849 Other hyperlipidemia: Secondary | ICD-10-CM | POA: Diagnosis not present

## 2019-03-21 DIAGNOSIS — Z96651 Presence of right artificial knee joint: Secondary | ICD-10-CM | POA: Diagnosis not present

## 2019-03-21 DIAGNOSIS — M25572 Pain in left ankle and joints of left foot: Secondary | ICD-10-CM | POA: Diagnosis not present

## 2019-03-21 DIAGNOSIS — R531 Weakness: Secondary | ICD-10-CM | POA: Diagnosis not present

## 2019-03-21 DIAGNOSIS — M25661 Stiffness of right knee, not elsewhere classified: Secondary | ICD-10-CM | POA: Diagnosis not present

## 2019-03-22 DIAGNOSIS — R82998 Other abnormal findings in urine: Secondary | ICD-10-CM | POA: Diagnosis not present

## 2019-03-26 DIAGNOSIS — Z1331 Encounter for screening for depression: Secondary | ICD-10-CM | POA: Diagnosis not present

## 2019-03-26 DIAGNOSIS — E039 Hypothyroidism, unspecified: Secondary | ICD-10-CM | POA: Diagnosis not present

## 2019-03-26 DIAGNOSIS — Z0000 Encounter for general adult medical examination without abnormal findings: Secondary | ICD-10-CM | POA: Diagnosis not present

## 2019-03-26 DIAGNOSIS — Z1339 Encounter for screening examination for other mental health and behavioral disorders: Secondary | ICD-10-CM | POA: Diagnosis not present

## 2019-03-26 DIAGNOSIS — R7301 Impaired fasting glucose: Secondary | ICD-10-CM | POA: Diagnosis not present

## 2019-03-26 DIAGNOSIS — K52832 Lymphocytic colitis: Secondary | ICD-10-CM | POA: Diagnosis not present

## 2019-03-26 DIAGNOSIS — E785 Hyperlipidemia, unspecified: Secondary | ICD-10-CM | POA: Diagnosis not present

## 2019-03-26 DIAGNOSIS — S82402A Unspecified fracture of shaft of left fibula, initial encounter for closed fracture: Secondary | ICD-10-CM | POA: Diagnosis not present

## 2019-03-26 DIAGNOSIS — B351 Tinea unguium: Secondary | ICD-10-CM | POA: Diagnosis not present

## 2019-03-27 ENCOUNTER — Ambulatory Visit
Admission: RE | Admit: 2019-03-27 | Discharge: 2019-03-27 | Disposition: A | Discharge status: Home / Self Care | Payer: PPO | Source: Ambulatory Visit | Attending: Internal Medicine | Admitting: Internal Medicine

## 2019-03-27 ENCOUNTER — Other Ambulatory Visit: Payer: Self-pay

## 2019-03-27 DIAGNOSIS — Z1231 Encounter for screening mammogram for malignant neoplasm of breast: Secondary | ICD-10-CM | POA: Diagnosis not present

## 2019-04-01 ENCOUNTER — Other Ambulatory Visit: Payer: Self-pay | Admitting: *Deleted

## 2019-04-01 ENCOUNTER — Telehealth: Payer: Self-pay

## 2019-04-01 ENCOUNTER — Other Ambulatory Visit: Payer: Self-pay

## 2019-04-01 NOTE — Telephone Encounter (Signed)
Patient called back in voice mail and said no spotting or bleeding but she called the discharge "brown" and only when she wipes. Does not come onto her underwear.  She also acknowleges at one time seeing a very small  "fibrous blood clot" like when she had periods. What to recommend?

## 2019-04-01 NOTE — Telephone Encounter (Signed)
Patient called in voice mail complaining of vaginal discharge and said Dr. Darnell Level had told her in the past that if ever vaginal discharge in menopause to come it.  I called her back in voice mail and asked her for a few more details. Mainly if she is seeing any spotting or bleeding or just vaginal discharge.  I told her if unusual vag dischg but no bleeding/spotting visit will be best to check it. Asked her to call me back.

## 2019-04-01 NOTE — Telephone Encounter (Signed)
Patient advised and appointment scheduled.

## 2019-04-01 NOTE — Patient Outreach (Signed)
[facility]) Care Management  04/01/2019  Vanessa Larsen [birth year] [id number]   Member will be followed by external care management program.  Will close case at this time, will notify primary MD of transition to external program.  Valente David, RN, MSN [place] Manager [phone]

## 2019-04-01 NOTE — Telephone Encounter (Signed)
Office visit at her convenience

## 2019-04-02 ENCOUNTER — Ambulatory Visit: Payer: 59 | Admitting: Gynecology

## 2019-04-04 ENCOUNTER — Other Ambulatory Visit: Payer: Self-pay

## 2019-04-04 ENCOUNTER — Encounter: Payer: Self-pay | Admitting: Gynecology

## 2019-04-04 ENCOUNTER — Ambulatory Visit: Payer: 59 | Admitting: Gynecology

## 2019-04-04 VITALS — BP 118/74

## 2019-04-04 DIAGNOSIS — N950 Postmenopausal bleeding: Secondary | ICD-10-CM | POA: Diagnosis not present

## 2019-04-04 DIAGNOSIS — N898 Other specified noninflammatory disorders of vagina: Secondary | ICD-10-CM | POA: Diagnosis not present

## 2019-04-04 LAB — WET PREP FOR TRICH, YEAST, CLUE

## 2019-04-04 MED ORDER — CLINDAMYCIN PHOSPHATE 2 % VA CREA: 1.0000 | TOPICAL_CREAM | Freq: Every day | VAGINAL | 0 refills | Status: DC

## 2019-04-04 NOTE — Addendum Note (Signed)
Addended by: Nelva Nay on: 04/04/2019 02:36 PM   Modules accepted: Orders

## 2019-04-04 NOTE — Progress Notes (Signed)
    Vanessa Larsen Apr 20, 1946 [id number]        73 y.o.  G3P3003 presents with an episode of brownish discharge.  No itching irritation or odor.  No pelvic discomfort or cramping.  No similar episodes previously.  Last Pap smear 07/2017  Past medical history,surgical history, problem list, medications, allergies, family history and social history were all reviewed and documented in the EPIC chart.  Directed ROS with pertinent positives and negatives documented in the history of present illness/assessment and plan.  Exam: Vanessa Larsen assistant Vitals:   04/04/19 1111  BP: 118/74   General appearance:  Normal Abdomen soft nontender without masses guarding rebound Pelvic external BUS vagina with atrophic changes.  Brown liquidy discharge in vaginal vault.  Cervix with atrophic changes.  Uterus normal size midline mobile nontender.  Adnexa without masses or tenderness.  Assessment/Plan:  73 y.o. [name] with postmenopausal bleeding.  We discussed differential with the patient to include atrophic, structural such as polyps/myomas, hyperplastic changes and uterine cancer.  Need for further evaluation reviewed.  Will schedule sonohysterogram for pelvic surveillance and endometrial assessment.  Patient will schedule in follow-up for this.    Vanessa Auerbach MD, 11:24 AM 04/04/2019

## 2019-04-04 NOTE — Patient Instructions (Signed)
Follow up for ultrasound as scheduled 

## 2019-04-29 ENCOUNTER — Other Ambulatory Visit: Payer: PPO

## 2019-04-29 ENCOUNTER — Other Ambulatory Visit: Payer: Self-pay | Admitting: Gynecology

## 2019-04-29 ENCOUNTER — Ambulatory Visit: Payer: PPO | Admitting: Gynecology

## 2019-04-29 DIAGNOSIS — N950 Postmenopausal bleeding: Secondary | ICD-10-CM

## 2019-05-02 DIAGNOSIS — Z471 Aftercare following joint replacement surgery: Secondary | ICD-10-CM | POA: Diagnosis not present

## 2019-05-02 DIAGNOSIS — Z96651 Presence of right artificial knee joint: Secondary | ICD-10-CM | POA: Diagnosis not present

## 2019-05-07 ENCOUNTER — Other Ambulatory Visit: Payer: Self-pay

## 2019-05-08 ENCOUNTER — Encounter: Payer: Self-pay | Admitting: Gynecology

## 2019-05-08 ENCOUNTER — Ambulatory Visit (INDEPENDENT_AMBULATORY_CARE_PROVIDER_SITE_OTHER): Payer: PPO | Admitting: Gynecology

## 2019-05-08 ENCOUNTER — Other Ambulatory Visit: Payer: Self-pay

## 2019-05-08 ENCOUNTER — Ambulatory Visit: Payer: PPO

## 2019-05-08 VITALS — BP 124/80

## 2019-05-08 DIAGNOSIS — N950 Postmenopausal bleeding: Secondary | ICD-10-CM

## 2019-05-08 DIAGNOSIS — N858 Other specified noninflammatory disorders of uterus: Secondary | ICD-10-CM | POA: Diagnosis not present

## 2019-05-08 NOTE — Patient Instructions (Signed)
Office will call with biopsy results 

## 2019-05-08 NOTE — Progress Notes (Signed)
    Vanessa Larsen Nov 29, 1945 [id number]        72 y.o.  G3P3003 presents for sonohysterogram due to episode of brownish discharge.  Past medical history,surgical history, problem list, medications, allergies, family history and social history were all reviewed and documented in the EPIC chart.  Directed ROS with pertinent positives and negatives documented in the history of present illness/assessment and plan.  Exam: [place] assistant Vitals:   05/08/19 1021  BP: 124/80   General appearance:  Normal Abdomen soft nontender without mass guarding rebound Pelvic external BUS vagina with atrophic changes.  Cervix with atrophic changes.  Uterus normal size midline mobile nontender.  Adnexa without masses or tenderness.  Ultrasound transvaginal shows uterus normal size and echotexture.  Endometrial echo thin at 1.7 mm.  Right ovary with 12 x 5 mm cyst previously noted on ultrasound.  Left ovary with 7 x 9 mm thick walled cyst noted on prior ultrasound.  Cul-de-sac negative.  Sonohysterogram performed, sterile technique, easy catheter introduction, good distention with no abnormalities seen.  Endometrial biopsy taken.  Patient tolerated well.  Assessment/Plan:  73 y.o. G3P3003 with episode of brown staining.  Ultrasound shows thin endometrium.  Patient will follow-up for biopsy results which I anticipate will show atrophic endometrium or insufficient for diagnoses which would be appropriate given the situation.  2 small cystic changes on her ovaries present years previously unchanged.  Will monitor for now and if any recurrent bleeding she will call.    Anastasio Auerbach MD, 10:22 AM 05/08/2019

## 2019-05-10 ENCOUNTER — Encounter: Payer: Self-pay | Admitting: Gynecology

## 2019-06-07 DIAGNOSIS — M67472 Ganglion, left ankle and foot: Secondary | ICD-10-CM | POA: Diagnosis not present

## 2019-06-07 DIAGNOSIS — M79672 Pain in left foot: Secondary | ICD-10-CM | POA: Diagnosis not present

## 2019-07-04 DIAGNOSIS — Z23 Encounter for immunization: Secondary | ICD-10-CM | POA: Diagnosis not present

## 2019-07-05 ENCOUNTER — Ambulatory Visit: Payer: 59 | Admitting: *Deleted

## 2019-07-10 ENCOUNTER — Encounter: Payer: Self-pay | Admitting: Gynecology

## 2019-07-25 ENCOUNTER — Encounter: Payer: Self-pay | Admitting: Gynecology

## 2019-08-09 ENCOUNTER — Other Ambulatory Visit: Payer: Self-pay

## 2019-08-09 DIAGNOSIS — Z13820 Encounter for screening for osteoporosis: Secondary | ICD-10-CM

## 2019-08-19 ENCOUNTER — Other Ambulatory Visit: Payer: Self-pay

## 2019-08-20 ENCOUNTER — Ambulatory Visit (INDEPENDENT_AMBULATORY_CARE_PROVIDER_SITE_OTHER): Payer: PPO | Admitting: Gynecology

## 2019-08-20 ENCOUNTER — Encounter: Payer: Self-pay | Admitting: Gynecology

## 2019-08-20 ENCOUNTER — Ambulatory Visit (INDEPENDENT_AMBULATORY_CARE_PROVIDER_SITE_OTHER): Payer: PPO

## 2019-08-20 ENCOUNTER — Other Ambulatory Visit: Payer: Self-pay | Admitting: Gynecology

## 2019-08-20 VITALS — BP 120/74

## 2019-08-20 DIAGNOSIS — Z1239 Encounter for other screening for malignant neoplasm of breast: Secondary | ICD-10-CM | POA: Diagnosis not present

## 2019-08-20 DIAGNOSIS — Z13820 Encounter for screening for osteoporosis: Secondary | ICD-10-CM

## 2019-08-20 DIAGNOSIS — Z780 Asymptomatic menopausal state: Secondary | ICD-10-CM | POA: Diagnosis not present

## 2019-08-20 NOTE — Progress Notes (Signed)
    Vanessa Larsen [birth year] [id number]        73 y.o.  [id number] presents requesting breast exam.  She has no complaints but would like a screening breast exam.  She is in the office for her bone density.  She is up-to-date with her mammography last mammogram reported 03/2019.  Past medical history,surgical history, problem list, medications, allergies, family history and social history were all reviewed and documented in the EPIC chart.  Directed ROS with pertinent positives and negatives documented in the history of present illness/assessment and plan.  Exam: [name] assistant Vitals:   08/20/19 1440  BP: 120/74   General appearance:  Normal Both breasts examined lying and sitting without masses retractions discharge adenopathy.  Assessment/Plan:  73 y.o. [id number] with normal breast exam.  Follow-up when due for annual exam.  Continue with annual mammography.    Anastasio Auerbach MD, 3:18 PM 08/20/2019

## 2019-08-20 NOTE — Patient Instructions (Signed)
Follow-up when due for annual exam

## 2019-08-21 ENCOUNTER — Encounter: Payer: Self-pay | Admitting: Gynecology

## 2019-08-21 DIAGNOSIS — L814 Other melanin hyperpigmentation: Secondary | ICD-10-CM | POA: Diagnosis not present

## 2019-08-21 DIAGNOSIS — L309 Dermatitis, unspecified: Secondary | ICD-10-CM | POA: Diagnosis not present

## 2019-08-21 DIAGNOSIS — L821 Other seborrheic keratosis: Secondary | ICD-10-CM | POA: Diagnosis not present

## 2019-08-21 DIAGNOSIS — D1801 Hemangioma of skin and subcutaneous tissue: Secondary | ICD-10-CM | POA: Diagnosis not present

## 2019-08-21 DIAGNOSIS — D225 Melanocytic nevi of trunk: Secondary | ICD-10-CM | POA: Diagnosis not present

## 2019-10-24 ENCOUNTER — Ambulatory Visit: Payer: PPO | Attending: Internal Medicine

## 2019-10-24 DIAGNOSIS — Z23 Encounter for immunization: Secondary | ICD-10-CM

## 2019-10-24 NOTE — Progress Notes (Signed)
   Covid-19 Vaccination Clinic  Name:  CLARK NICK    MRN: [id number] DOB: [birth year]  10/24/2019  Ms. Barbier was observed post Covid-19 immunization for 15 minutes without incidence. She was provided with Vaccine Information Sheet and instruction to access the V-Safe system.   Ms. Bry was instructed to call 911 with any severe reactions post vaccine: Marland Kitchen Difficulty breathing  . Swelling of your face and throat  . A fast heartbeat  . A bad rash all over your body  . Dizziness and weakness    Immunizations Administered    Name Date Dose VIS Date Route   Pfizer COVID-19 Vaccine 10/24/2019 10:01 AM 0.3 mL 09/13/2019 Intramuscular   Manufacturer: [place]   Lot: [id number]   [place]: [id number]

## 2019-11-14 ENCOUNTER — Ambulatory Visit: Payer: PPO | Attending: Internal Medicine

## 2019-11-14 DIAGNOSIS — Z23 Encounter for immunization: Secondary | ICD-10-CM | POA: Insufficient documentation

## 2019-11-14 NOTE — Progress Notes (Signed)
   Covid-19 Vaccination Clinic  Name:  Vanessa Larsen    MRN: [id number] DOB: January 04, 1946  11/14/2019  [name] was observed post Covid-19 immunization for 15 minutes without incidence. She was provided with Vaccine Information Sheet and instruction to access the V-Safe system.   [name] was instructed to call 911 with any severe reactions post vaccine: Marland Kitchen Difficulty breathing  . Swelling of your face and throat  . A fast heartbeat  . A bad rash all over your body  . Dizziness and weakness    Immunizations Administered    Name Date Dose VIS Date Route   Pfizer COVID-19 Vaccine 11/14/2019 10:28 AM 0.3 mL 09/13/2019 Intramuscular   Manufacturer: [place]   Lot: [id number]   [place]: [id number]

## 2019-11-20 ENCOUNTER — Ambulatory Visit: Payer: PPO

## 2019-12-31 DIAGNOSIS — R300 Dysuria: Secondary | ICD-10-CM | POA: Diagnosis not present

## 2019-12-31 DIAGNOSIS — N390 Urinary tract infection, site not specified: Secondary | ICD-10-CM | POA: Diagnosis not present

## 2020-03-03 ENCOUNTER — Telehealth: Payer: Self-pay | Admitting: Gastroenterology

## 2020-03-03 NOTE — Telephone Encounter (Signed)
Spoke with the patient. She has started having nocturnal diarrhea in the last month. She noticed she was having more diarrhea several months ago. Recently it started occurring during the night. No fecal incontinence. But it does wake her up. She then will have diarrhea stools in the morning. Then she is "good for the day." She states this reminds her of when she had microscopic colitis but not yet to the level of severity as before. Afebrile. No chills, body aches, or bloody stools. Can she be treated empirically?

## 2020-03-04 ENCOUNTER — Other Ambulatory Visit: Payer: Self-pay

## 2020-03-04 ENCOUNTER — Other Ambulatory Visit: Payer: PPO

## 2020-03-04 DIAGNOSIS — R197 Diarrhea, unspecified: Secondary | ICD-10-CM

## 2020-03-04 DIAGNOSIS — R1084 Generalized abdominal pain: Secondary | ICD-10-CM

## 2020-03-04 NOTE — Telephone Encounter (Signed)
Please request GI pathogen panel to exclude any infectious etiology.  If negative for infection will start her on budesonide empirically for possible recurrent microscopic colitis.  Schedule follow-up office visit, she has not been seen in the past 3 years.  Thank you

## 2020-03-04 NOTE — Telephone Encounter (Signed)
Call to the patient. No answer. Left information on her voicemail. Order for stool GI pathogen panel placed.

## 2020-03-05 ENCOUNTER — Telehealth: Payer: Self-pay | Admitting: Gastroenterology

## 2020-03-05 ENCOUNTER — Other Ambulatory Visit: Payer: PPO

## 2020-03-05 DIAGNOSIS — R197 Diarrhea, unspecified: Secondary | ICD-10-CM | POA: Diagnosis not present

## 2020-03-05 DIAGNOSIS — R1084 Generalized abdominal pain: Secondary | ICD-10-CM

## 2020-03-05 NOTE — Telephone Encounter (Signed)
Patient calling with questions

## 2020-03-06 NOTE — Telephone Encounter (Signed)
I did not see that the patient had called back with questions. She has submitted the stool specimen. Called the patient back. No answer. Left a voicemail offering my apologies for not getting back to her. Acknowledged that the lab is in-process and I will call her as soon as results are available.

## 2020-03-07 LAB — GI PROFILE, STOOL, PCR

## 2020-03-10 ENCOUNTER — Telehealth: Payer: Self-pay

## 2020-03-10 NOTE — Telephone Encounter (Signed)
Patient is still having the diarrhea. I can get her in to see an APP in a couple of weeks. Is this okay? Is there anything you would have her do in the meanwhile?

## 2020-03-10 NOTE — Telephone Encounter (Signed)
Dr Silverio Decamp I think this message was pulled away from you. Sorry if you have it twice. The stool culture was done because of the diarrhea and discomfort that Vanessa Larsen has been having which reminds her of how she felt when she had microscopic colitis. She will be out of town for a couple of weeks and hopes she can start treatment now, with follow up when she returns.  Please advise

## 2020-03-10 NOTE — Telephone Encounter (Signed)
-----   Message from Mauri Pole, MD sent at 03/07/2020  9:45 AM EDT ----- No evidence of infection. Negative GI pathogen panel

## 2020-03-11 ENCOUNTER — Other Ambulatory Visit: Payer: Self-pay

## 2020-03-11 MED ORDER — BUDESONIDE 3 MG PO CPEP: 9.0000 mg | ORAL_CAPSULE | Freq: Every day | ORAL | 0 refills | Status: DC

## 2020-03-11 NOTE — Telephone Encounter (Signed)
Got it. Spoke with the patient. Rx called to [facility] [phone] Follow up appointment scheduled.

## 2020-03-11 NOTE — Telephone Encounter (Signed)
I already recommended starting Budesonide 9mg  daily X30 days with follow up visit soon, please check if someone else took care of it? May be it was a Manufacturing engineer

## 2020-03-24 ENCOUNTER — Other Ambulatory Visit: Payer: Self-pay | Admitting: Internal Medicine

## 2020-03-24 DIAGNOSIS — Z1231 Encounter for screening mammogram for malignant neoplasm of breast: Secondary | ICD-10-CM

## 2020-04-01 ENCOUNTER — Encounter: Payer: Self-pay | Admitting: Gastroenterology

## 2020-04-01 ENCOUNTER — Ambulatory Visit: Payer: PPO | Admitting: Gastroenterology

## 2020-04-01 VITALS — BP 128/76 | HR 76 | Ht 69.0 in | Wt 142.0 lb

## 2020-04-01 DIAGNOSIS — E739 Lactose intolerance, unspecified: Secondary | ICD-10-CM | POA: Diagnosis not present

## 2020-04-01 DIAGNOSIS — K52832 Lymphocytic colitis: Secondary | ICD-10-CM | POA: Diagnosis not present

## 2020-04-01 MED ORDER — BUDESONIDE 3 MG PO CPEP: 9.0000 mg | ORAL_CAPSULE | Freq: Every day | ORAL | 0 refills | Status: DC

## 2020-04-01 NOTE — Progress Notes (Signed)
Vanessa Larsen    [id number]    [birth year]  Primary Care Physician:Shaw, Gwyndolyn Saxon, MD  Referring Physician: Marton Redwood, MD [address] [place],  [place] [address]   Chief complaint: Diarrhea HPI:  74 year old very pleasant female here for follow-up visit for chronic diarrhea  She started having recurrent progressive worsening of diarrhea last month with nocturnal symptoms, loose stool and increased fecal urgency  GI pathogen panel negative for any infectious etiology  Restarted on budesonide 9 mg daily 2 weeks ago, her symptoms have significantly improved.  As any melena or rectal bleeding.  No abdominal pain  No new medications or over-the-counter supplements  At baseline she does have increased bowel frequency and semiformed stool.  She feels her symptoms are worse whenever she eats out.  She does not consume much milk or dairy products but does like to eat cereal with milk  History of chronic iron deficiency anemia with heme positive stool  Small bowel video capsule: July 26, 2017 complete study with good prep.  Negative  EGD August 2018: Mild erosive esophagitis and gastritis, negative for H. pylori  Colonoscopy August 2018: Biopsies positive for lymphocytic colitis otherwise unremarkable exam   Outpatient Encounter Medications as of 04/01/2020  Medication Sig  . atorvastatin (LIPITOR) 20 MG tablet Take 20 mg by mouth every evening.   . budesonide (ENTOCORT EC) 3 MG 24 hr capsule Take 3 capsules (9 mg total) by mouth daily.  . Calcium Carb-Cholecalciferol (CALCIUM 1000 + D PO) Take 1 capsule by mouth daily.    Marland Kitchen levothyroxine (SYNTHROID, LEVOTHROID) 100 MCG tablet Take 100 mcg by mouth daily before breakfast.   . Multiple Vitamins-Minerals (MULTIVITAMIN WITH MINERALS) tablet Take 1 tablet by mouth daily.     Facility-Administered Encounter Medications as of 04/01/2020  Medication  . 0.9 %  sodium chloride infusion  . 0.9 %  sodium chloride  infusion    Allergies as of 04/01/2020 - Review Complete 04/01/2020  Allergen Reaction Noted  . Fentanyl Nausea And Vomiting 06/30/2016  . Oxycodone  08/21/2018    Past Medical History:  Diagnosis Date  . Anal fissure   . Arthritis   . Elevated cholesterol   . Hyperplastic colon polyp   . Hypothyroidism   . IBS (irritable bowel syndrome)   . Internal hemorrhoids    Small  . Microscopic colitis   . Osteoarthritis   . Ovarian cyst, left   . PONV (postoperative nausea and vomiting)   . Vasovagal syncope    very easily, on airplanes (trans atlanic), after sleeping    Past Surgical History:  Procedure Laterality Date  . BREAST BIOPSY Left    neg  . CATARACT EXTRACTION, BILATERAL    . COLONOSCOPY    . COLPOSCOPY    . EYE SURGERY     cosmetic  . GYNECOLOGIC CRYOSURGERY     age 44  . TOTAL KNEE ARTHROPLASTY Right 04/16/2018   Procedure: RIGHT TOTAL KNEE ARTHROPLASTY;  Surgeon: Gaynelle Arabian, MD;  Location: [facility];  Service: Orthopedics;  Laterality: Right;  . UPPER GI ENDOSCOPY      Family History  Problem Relation Age of Onset  . Pancreatic cancer Father   . Heart disease Father   . Esophageal cancer Mother   . Diabetes Cousin   . Heart disease Paternal Grandmother   . Peptic Ulcer Sister   . Gallstones Sister   . Colon cancer Neg Hx   . Breast cancer  Neg Hx     Social History   Socioeconomic History  . Marital status: Married    Spouse name: Not on file  . Number of children: 3  . Years of education: Not on file  . Highest education level: Not on file  Occupational History  . Occupation: homemaker  Tobacco Use  . Smoking status: Former Smoker    Quit date: 10/04/1971    Years since quitting: 48.5  . Smokeless tobacco: Never Used  Vaping Use  . Vaping Use: Never used  Substance and Sexual Activity  . Alcohol use: Yes    Alcohol/week: 14.0 standard drinks    Types: 14 Standard drinks or equivalent per week    Comment: 2 drinks per day  . Drug use:  No  . Sexual activity: Yes    Birth control/protection: Post-menopausal    Comment: 1st intercourse 74 yo-Fewer than 5 partners  Other Topics Concern  . Not on file  Social History Narrative  . Not on file   Social Determinants of Health   Financial Resource Strain:   . Difficulty of Paying Living Expenses:   Food Insecurity:   . Worried About Charity fundraiser in the Last Year:   . Arboriculturist in the Last Year:   Transportation Needs:   . Film/video editor (Medical):   Marland Kitchen Lack of Transportation (Non-Medical):   Physical Activity:   . Days of Exercise per Week:   . Minutes of Exercise per Session:   Stress:   . Feeling of Stress :   Social Connections:   . Frequency of Communication with Friends and Family:   . Frequency of Social Gatherings with Friends and Family:   . Attends Religious Services:   . Active Member of Clubs or Organizations:   . Attends Archivist Meetings:   Marland Kitchen Marital Status:   Intimate Partner Violence:   . Fear of Current or Ex-Partner:   . Emotionally Abused:   Marland Kitchen Physically Abused:   . Sexually Abused:       Review of systems:  All other review of systems negative except as mentioned in the HPI.   Physical Exam: Vitals:   04/01/20 1548  BP: 128/76  Pulse: 76   Body mass index is 20.97 kg/m. Gen:      No acute distress Neuro: alert and oriented x 3 Psych: normal mood and affect  Data Reviewed:  Reviewed labs, radiology imaging, old records and pertinent past GI work up   Assessment and Plan/Recommendations:  74 year old female with history of chronic iron deficiency anemia, erosive gastritis, negative H. pylori and lymphocytic colitis with worsening diarrhea suspicious for recurrent lymphocytic colitis  Symptoms are improving after restarting budesonide 9 mg daily, advised patient to continue 5 mg daily for total 4-week course followed by taper dose 6 mg daily for 3 weeks and 3 mg daily for additional 3 to 4  weeks  Trial of lactose-free diet  Continue to limit the use of NSAIDs  If has recurrent diarrhea, plan to start Pepto-Bismol 1 tablet 3 times daily with meals for 3 months  Return in 2 to 3 months or sooner if needed   The patient was provided an opportunity to ask questions and all were answered. The patient agreed with the plan and demonstrated an understanding of the instructions.  Damaris Hippo , MD    CC: Marton Redwood, MD

## 2020-04-01 NOTE — Patient Instructions (Signed)
Budesonide taper: Take 9 mg x 4 weeks then take 6 mg daily x 3 weeks then decrease to 3 mg daily until finished then discontinue  Take Pepto bismol 1 tablet three times a day with meals   Lactose-Free Diet, Adult If you have lactose intolerance, you are not able to digest lactose. Lactose is a natural sugar found mainly in dairy milk and dairy products. You may need to avoid all foods and beverages that contain lactose. A lactose-free diet can help you do this. Which foods have lactose? Lactose is found in dairy milk and dairy products, such as:  Yogurt.  Cheese.  Butter.  Margarine.  Sour cream.  Cream.  Whipped toppings and nondairy creamers.  Ice cream and other dairy-based desserts. Lactose is also found in foods or products made with dairy milk or milk ingredients. To find out whether a food contains dairy milk or a milk ingredient, look at the ingredients list. Avoid foods with the statement "May contain milk" and foods that contain:  Milk powder.  Whey.  Curd.  Caseinate.  Lactose.  Lactalbumin.  Lactoglobulin. What are alternatives to dairy milk and foods made with milk products?  Lactose-free milk.  Soy milk with added calcium and vitamin D.  Almond milk, coconut milk, rice milk, or other nondairy milk alternatives with added calcium and vitamin D. Note that these are low in protein.  Soy products, such as soy yogurt, soy cheese, soy ice cream, and soy-based sour cream.  Other nut milk products, such as almond yogurt, almond cheese, cashew yogurt, cashew cheese, cashew ice cream, coconut yogurt, and coconut ice cream. What are tips for following this plan?  Do not consume foods, beverages, vitamins, minerals, or medicines containing lactose. Read ingredient lists carefully.  Look for the words "lactose-free" on labels.  Use lactase enzyme drops or tablets as directed by your health care provider.  Use lactose-free milk or a milk alternative, such as  soy milk or almond milk, for drinking and cooking.  Make sure you get enough calcium and vitamin D in your diet. A lactose-free eating plan can be lacking in these important nutrients.  Take calcium and vitamin D supplements as directed by your health care provider. Talk to your health care provider about supplements if you are not able to get enough calcium and vitamin D from food. What foods can I eat?  Fruits All fresh, canned, frozen, or dried fruits that are not processed with lactose. Vegetables All fresh, frozen, and canned vegetables without cheese, cream, or butter sauces. Grains Any that are not made with dairy milk or dairy products. Meats and other proteins Any meat, fish, poultry, and other protein sources that are not made with dairy milk or dairy products. Soy cheese and yogurt. Fats and oils Any that are not made with dairy milk or dairy products. Beverages Lactose-free milk. Soy, rice, or almond milk with added calcium and vitamin D. Fruit and vegetable juices. Sweets and desserts Any that are not made with dairy milk or dairy products. Seasonings and condiments Any that are not made with dairy milk or dairy products. Calcium Calcium is found in many foods that contain lactose and is important for bone health. The amount of calcium you need depends on your age:  Adults younger than 50 years: 1,000 mg of calcium a day.  Adults older than 50 years: 1,200 mg of calcium a day. If you are not getting enough calcium, you may get it from other sources, including:  Orange juice with calcium added. There are 300-350 mg of calcium in 1 cup of orange juice.  Calcium-fortified soy milk. There are 300-400 mg of calcium in 1 cup of calcium-fortified soy milk.  Calcium-fortified rice or almond milk. There are 300 mg of calcium in 1 cup of calcium-fortified rice or almond milk.  Calcium-fortified breakfast cereals. There are 100-1,000 mg of calcium in calcium-fortified breakfast  cereals.  Spinach, cooked. There are 145 mg of calcium in  cup of cooked spinach.  Edamame, cooked. There are 130 mg of calcium in  cup of cooked edamame.  Collard greens, cooked. There are 125 mg of calcium in  cup of cooked collard greens.  Kale, frozen or cooked. There are 90 mg of calcium in  cup of cooked or frozen kale.  Almonds. There are 95 mg of calcium in  cup of almonds.  Broccoli, cooked. There are 60 mg of calcium in 1 cup of cooked broccoli. The items listed above may not be a complete list of recommended foods and beverages. Contact a dietitian for more options. What foods are not recommended? Fruits None, unless they are made with dairy milk or dairy products. Vegetables None, unless they are made with dairy milk or dairy products. Grains Any grains that are made with dairy milk or dairy products. Meats and other proteins None, unless they are made with dairy milk or dairy products. Dairy All dairy products, including milk, goat's milk, buttermilk, kefir, acidophilus milk, flavored milk, evaporated milk, condensed milk, dulce de [place], eggnog, yogurt, cheese, and cheese spreads. Fats and oils Any that are made with milk or milk products. Margarines and salad dressings that contain milk or cheese. Cream. Half and half. Cream cheese. Sour cream. Chip dips made with sour cream or yogurt. Beverages Hot chocolate. Cocoa with lactose. Instant iced teas. Powdered fruit drinks. Smoothies made with dairy milk or yogurt. Sweets and desserts Any that are made with milk or milk products. Seasonings and condiments Chewing gum that has lactose. Spice blends if they contain lactose. Artificial sweeteners that contain lactose. Nondairy creamers. The items listed above may not be a complete list of foods and beverages to avoid. Contact a dietitian for more information. Summary  If you are lactose intolerant, it means that you have a hard time digesting lactose, a natural  sugar found in milk and milk products.  Following a lactose-free diet can help you manage this condition.  Calcium is important for bone health and is found in many foods that contain lactose. Talk with your health care provider about other sources of calcium. This information is not intended to replace advice given to you by your health care provider. Make sure you discuss any questions you have with your health care provider. Document Revised: 10/17/2017 Document Reviewed: 10/17/2017 Elsevier Patient Education  [place].   I appreciate the  opportunity to care for you  Thank You   Harl Bowie , MD

## 2020-04-08 ENCOUNTER — Ambulatory Visit: Payer: PPO

## 2020-04-09 ENCOUNTER — Other Ambulatory Visit: Payer: Self-pay

## 2020-04-09 ENCOUNTER — Ambulatory Visit
Admission: RE | Admit: 2020-04-09 | Discharge: 2020-04-09 | Disposition: A | Discharge status: Home / Self Care | Payer: PPO | Source: Ambulatory Visit | Attending: Internal Medicine | Admitting: Internal Medicine

## 2020-04-09 DIAGNOSIS — Z1231 Encounter for screening mammogram for malignant neoplasm of breast: Secondary | ICD-10-CM | POA: Diagnosis not present

## 2020-04-27 ENCOUNTER — Telehealth: Payer: Self-pay | Admitting: Gastroenterology

## 2020-04-27 MED ORDER — BUDESONIDE 3 MG PO CPEP: 9.0000 mg | ORAL_CAPSULE | Freq: Every day | ORAL | 0 refills | Status: DC

## 2020-04-27 NOTE — Telephone Encounter (Signed)
Returned patients call, I sent her #30 pills which is 10 days worth to the [facility] in [place]   She stated she only needed 10 pills not 10 days worth, Per message taken ist was a 10 day supply but the patient takes 3 per day   She will get them to fix the qty at the pharmacy

## 2020-05-22 DIAGNOSIS — E785 Hyperlipidemia, unspecified: Secondary | ICD-10-CM | POA: Diagnosis not present

## 2020-06-10 ENCOUNTER — Ambulatory Visit: Payer: PPO | Admitting: Gastroenterology

## 2020-06-10 ENCOUNTER — Encounter: Payer: Self-pay | Admitting: Gastroenterology

## 2020-06-10 VITALS — BP 130/78 | HR 71 | Ht 69.0 in | Wt 140.2 lb

## 2020-06-10 DIAGNOSIS — K52832 Lymphocytic colitis: Secondary | ICD-10-CM

## 2020-06-10 DIAGNOSIS — K580 Irritable bowel syndrome with diarrhea: Secondary | ICD-10-CM

## 2020-06-10 NOTE — Patient Instructions (Signed)
Follow up in 6 months  Use Peptobismol 1 tablet three times a day with meals  Use Imodium as needed  If you are age 73 or older, your body mass index should be between 23-30. Your Body mass index is 20.7 kg/m. If this is out of the aforementioned range listed, please consider follow up with your Primary Care Provider.  If you are age 76 or younger, your body mass index should be between 19-25. Your Body mass index is 20.7 kg/m. If this is out of the aformentioned range listed, please consider follow up with your Primary Care Provider.    I appreciate the  opportunity to care for you  Thank You   Harl Bowie , MD

## 2020-06-10 NOTE — Progress Notes (Signed)
Vanessa Larsen    [id number]    August 25, 1946  Primary Care Physician:Shaw, Gwyndolyn Saxon, MD  Referring Physician: Marton Redwood, MD [address] [place],  [place] [address]   Chief complaint: Lymphocytic colitis HPI:  74 year old very pleasant female here for follow-up visit for lymphocytic colitis  She completed budesonide taper.  No longer having nocturnal diarrhea or fecal urgency  She continues to have intermittent episodes of diarrhea, worse when she eats outside but not on regular basis.  No significant change in her symptoms with lactose-free diet  Father had pancreatic cancer, he was a chronic smoker.  Mother had esophageal cancer She is worried about possible pancreatic cancer.  Denies any abdominal pain, loss of appetite or weight loss  No new medications, avoids excessive use of NSAIDs  GI pathogen panel negative for infectious etiology  Normal stool fat, negative for pancreatic insufficiency  Has not tried taking Pepto-Bismol yet.  She took cholestyramine for few weeks in 2018, her symptoms were worse subsequently   Outpatient Encounter Medications as of 06/10/2020  Medication Sig  . atorvastatin (LIPITOR) 20 MG tablet Take 20 mg by mouth every evening.   . Calcium Carb-Cholecalciferol (CALCIUM 1000 + D PO) Take 1 capsule by mouth daily.    Marland Kitchen levothyroxine (SYNTHROID, LEVOTHROID) 100 MCG tablet Take 100 mcg by mouth daily before breakfast.   . Multiple Vitamins-Minerals (MULTIVITAMIN WITH MINERALS) tablet Take 1 tablet by mouth daily.    . [DISCONTINUED] budesonide (ENTOCORT EC) 3 MG 24 hr capsule Take 3 capsules (9 mg total) by mouth daily.  . [DISCONTINUED] 0.9 %  sodium chloride infusion   . [DISCONTINUED] 0.9 %  sodium chloride infusion    No facility-administered encounter medications on file as of 06/10/2020.    Allergies as of 06/10/2020 - Review Complete 06/10/2020  Allergen Reaction Noted  . Fentanyl Nausea And Vomiting 06/30/2016  .  Oxycodone  08/21/2018    Past Medical History:  Diagnosis Date  . Anal fissure   . Arthritis   . Elevated cholesterol   . Hyperplastic colon polyp   . Hypothyroidism   . IBS (irritable bowel syndrome)   . Internal hemorrhoids    Small  . Microscopic colitis   . Osteoarthritis   . Ovarian cyst, left   . PONV (postoperative nausea and vomiting)   . Vasovagal syncope    very easily, on airplanes (trans atlanic), after sleeping    Past Surgical History:  Procedure Laterality Date  . BREAST BIOPSY Left    neg  . CATARACT EXTRACTION, BILATERAL    . COLONOSCOPY    . COLPOSCOPY    . EYE SURGERY     cosmetic  . GYNECOLOGIC CRYOSURGERY     age 46  . TOTAL KNEE ARTHROPLASTY Right 04/16/2018   Procedure: RIGHT TOTAL KNEE ARTHROPLASTY;  Surgeon: Gaynelle Arabian, MD;  Location: [facility];  Service: Orthopedics;  Laterality: Right;  . UPPER GI ENDOSCOPY      Family History  Problem Relation Age of Onset  . Pancreatic cancer Father   . Heart disease Father   . Esophageal cancer Mother   . Diabetes Cousin   . Heart disease Paternal Grandmother   . Peptic Ulcer Sister   . Gallstones Sister   . Colon cancer Neg Hx   . Breast cancer Neg Hx     Social History   Socioeconomic History  . Marital status: Married    Spouse name: Not on file  .  Number of children: 3  . Years of education: Not on file  . Highest education level: Not on file  Occupational History  . Occupation: homemaker  Tobacco Use  . Smoking status: Former Smoker    Quit date: 10/04/1971    Years since quitting: 48.7  . Smokeless tobacco: Never Used  Vaping Use  . Vaping Use: Never used  Substance and Sexual Activity  . Alcohol use: Yes    Alcohol/week: 14.0 standard drinks    Types: 14 Standard drinks or equivalent per week    Comment: 2 drinks per day  . Drug use: No  . Sexual activity: Yes    Birth control/protection: Post-menopausal    Comment: 1st intercourse 74 yo-Fewer than 5 partners  Other Topics  Concern  . Not on file  Social History Narrative  . Not on file   Social Determinants of Health   Financial Resource Strain:   . Difficulty of Paying Living Expenses: Not on file  Food Insecurity:   . Worried About Charity fundraiser in the Last Year: Not on file  . Ran Out of Food in the Last Year: Not on file  Transportation Needs:   . Lack of Transportation (Medical): Not on file  . Lack of Transportation (Non-Medical): Not on file  Physical Activity:   . Days of Exercise per Week: Not on file  . Minutes of Exercise per Session: Not on file  Stress:   . Feeling of Stress : Not on file  Social Connections:   . Frequency of Communication with Friends and Family: Not on file  . Frequency of Social Gatherings with Friends and Family: Not on file  . Attends Religious Services: Not on file  . Active Member of Clubs or Organizations: Not on file  . Attends Archivist Meetings: Not on file  . Marital Status: Not on file  Intimate Partner Violence:   . Fear of Current or Ex-Partner: Not on file  . Emotionally Abused: Not on file  . Physically Abused: Not on file  . Sexually Abused: Not on file      Review of systems: All other review of systems negative except as mentioned in the HPI.   Physical Exam: Vitals:   06/10/20 1110  BP: 130/78  Pulse: 71  SpO2: 98%   Body mass index is 20.7 kg/m. Gen:      No acute distress Abdomen: Soft no tenderness or distention. Neuro: alert and oriented x 3 Psych: normal mood and affect  Data Reviewed:  Reviewed labs, radiology imaging, old records and pertinent past GI work up   Assessment and Plan/Recommendations:  74 year old very pleasant female with history of lymphocytic colitis, recurrent episode treated with budesonide taper  Symptoms have improved significantly with budesonide  Intermittent diarrhea likely secondary to irritable bowel syndrome, okay to use Imodium as needed  Pepto-Bismol 1 tablet 3 times  daily with meals for 3 months to prevent recurrent lymphocytic colitis  No evidence of pancreatic insufficiency, discussed imaging to screen for pancreatic cancer though routine screening is not recommended for pancreatic cancer due to potential accumulated radiation related adverse reaction. She is asymptomatic.  Her father, physician developed pancreatic cancer later in life and he was chronic smoker with many pack years smoking history Overall her risk for pancreatic cancer is lower, but low threshold to do imaging if develops any new symptoms  Return in 6 months or sooner if needed  This visit required 40 minutes of patient care (this  includes precharting, chart review, review of results, face-to-face time used for counseling as well as treatment plan and follow-up. The patient was provided an opportunity to ask questions and all were answered. The patient agreed with the plan and demonstrated an understanding of the instructions.  Damaris Hippo , MD    CC: Marton Redwood, MD

## 2020-06-12 ENCOUNTER — Encounter: Payer: Self-pay | Admitting: Gastroenterology

## 2020-06-15 DIAGNOSIS — R7301 Impaired fasting glucose: Secondary | ICD-10-CM | POA: Diagnosis not present

## 2020-06-15 DIAGNOSIS — E7849 Other hyperlipidemia: Secondary | ICD-10-CM | POA: Diagnosis not present

## 2020-06-15 DIAGNOSIS — E039 Hypothyroidism, unspecified: Secondary | ICD-10-CM | POA: Diagnosis not present

## 2020-06-22 DIAGNOSIS — E7849 Other hyperlipidemia: Secondary | ICD-10-CM | POA: Diagnosis not present

## 2020-06-22 DIAGNOSIS — Z1331 Encounter for screening for depression: Secondary | ICD-10-CM | POA: Diagnosis not present

## 2020-06-22 DIAGNOSIS — G629 Polyneuropathy, unspecified: Secondary | ICD-10-CM | POA: Diagnosis not present

## 2020-06-22 DIAGNOSIS — R350 Frequency of micturition: Secondary | ICD-10-CM | POA: Diagnosis not present

## 2020-06-22 DIAGNOSIS — M179 Osteoarthritis of knee, unspecified: Secondary | ICD-10-CM | POA: Diagnosis not present

## 2020-06-22 DIAGNOSIS — E039 Hypothyroidism, unspecified: Secondary | ICD-10-CM | POA: Diagnosis not present

## 2020-06-22 DIAGNOSIS — R82998 Other abnormal findings in urine: Secondary | ICD-10-CM | POA: Diagnosis not present

## 2020-06-22 DIAGNOSIS — Z0000 Encounter for general adult medical examination without abnormal findings: Secondary | ICD-10-CM | POA: Diagnosis not present

## 2020-06-22 DIAGNOSIS — I10 Essential (primary) hypertension: Secondary | ICD-10-CM | POA: Diagnosis not present

## 2020-06-22 DIAGNOSIS — R7301 Impaired fasting glucose: Secondary | ICD-10-CM | POA: Diagnosis not present

## 2020-06-22 DIAGNOSIS — Z1339 Encounter for screening examination for other mental health and behavioral disorders: Secondary | ICD-10-CM | POA: Diagnosis not present

## 2020-06-22 DIAGNOSIS — K52832 Lymphocytic colitis: Secondary | ICD-10-CM | POA: Diagnosis not present

## 2020-07-01 DIAGNOSIS — D1801 Hemangioma of skin and subcutaneous tissue: Secondary | ICD-10-CM | POA: Diagnosis not present

## 2020-07-01 DIAGNOSIS — L821 Other seborrheic keratosis: Secondary | ICD-10-CM | POA: Diagnosis not present

## 2020-07-01 DIAGNOSIS — L820 Inflamed seborrheic keratosis: Secondary | ICD-10-CM | POA: Diagnosis not present

## 2020-07-01 DIAGNOSIS — L814 Other melanin hyperpigmentation: Secondary | ICD-10-CM | POA: Diagnosis not present

## 2020-07-01 DIAGNOSIS — L570 Actinic keratosis: Secondary | ICD-10-CM | POA: Diagnosis not present

## 2020-07-01 DIAGNOSIS — L918 Other hypertrophic disorders of the skin: Secondary | ICD-10-CM | POA: Diagnosis not present

## 2020-07-22 DIAGNOSIS — I10 Essential (primary) hypertension: Secondary | ICD-10-CM | POA: Diagnosis not present

## 2020-07-22 DIAGNOSIS — Z23 Encounter for immunization: Secondary | ICD-10-CM | POA: Diagnosis not present

## 2020-08-10 ENCOUNTER — Other Ambulatory Visit: Payer: Self-pay

## 2020-08-10 ENCOUNTER — Telehealth: Payer: Self-pay

## 2020-08-10 DIAGNOSIS — R197 Diarrhea, unspecified: Secondary | ICD-10-CM

## 2020-08-10 NOTE — Telephone Encounter (Signed)
Mauri Pole, MD  Keilani Terrance, Real Cons, LPN Beth, please have her submit stool sample for GI pathogen panel to exclude any acute infections causing the recent episode. Please send Rx and advise patient to restart budesonide 9 mg daily 60 days followed by taper 6 mg daily for 2 weeks and 3 mg daily for 4 weeks after confirming that there is no active GI infection. Stop Pepto-Bismol. Schedule office follow-up visit soon with me or app.

## 2020-08-10 NOTE — Telephone Encounter (Signed)
Discussed with the patient. She will come to the lab tomorrow. Medication orders help until pathogen panel has resulted.

## 2020-08-11 ENCOUNTER — Other Ambulatory Visit: Payer: PPO

## 2020-08-11 DIAGNOSIS — R197 Diarrhea, unspecified: Secondary | ICD-10-CM

## 2020-08-14 ENCOUNTER — Other Ambulatory Visit: Payer: PPO

## 2020-08-14 DIAGNOSIS — R197 Diarrhea, unspecified: Secondary | ICD-10-CM

## 2020-08-18 ENCOUNTER — Telehealth: Payer: Self-pay

## 2020-08-18 ENCOUNTER — Other Ambulatory Visit: Payer: Self-pay

## 2020-08-18 LAB — GI PROFILE, STOOL, PCR

## 2020-08-18 MED ORDER — BUDESONIDE 3 MG PO CPEP
9.0000 mg | ORAL_CAPSULE | Freq: Every day | ORAL | 1 refills | Status: DC
Start: 2020-08-18 — End: 2020-10-06

## 2020-08-18 NOTE — Telephone Encounter (Signed)
-----   Message from Mauri Pole, MD sent at 08/18/2020  1:16 PM EST ----- Jana Half, Stool test is negative for acute GI infection. Please start the Budesonide 9mg  daily.  Please schedule office follow up visit in 6-8 weeks and call with any change or worsening symptoms.  Hope you feel better.

## 2020-08-18 NOTE — Telephone Encounter (Signed)
Left message for patient to please call back. 

## 2020-09-01 DIAGNOSIS — Z961 Presence of intraocular lens: Secondary | ICD-10-CM | POA: Diagnosis not present

## 2020-09-15 ENCOUNTER — Ambulatory Visit: Payer: PPO | Admitting: Obstetrics and Gynecology

## 2020-09-15 ENCOUNTER — Encounter: Payer: Self-pay | Admitting: Obstetrics and Gynecology

## 2020-09-15 ENCOUNTER — Other Ambulatory Visit: Payer: Self-pay

## 2020-09-15 VITALS — BP 140/88 | Ht 68.5 in | Wt 146.0 lb

## 2020-09-15 DIAGNOSIS — Z01419 Encounter for gynecological examination (general) (routine) without abnormal findings: Secondary | ICD-10-CM

## 2020-09-15 NOTE — Progress Notes (Signed)
   Vanessa Larsen [facility] July 26, 1946 [id number]  SUBJECTIVE:  74 y.o. G3P3003 female here for a breast and pelvic exam. She has no gynecologic concerns.  Current Outpatient Medications  Medication Sig Dispense Refill  . atorvastatin (LIPITOR) 20 MG tablet Take 20 mg by mouth every evening.     . budesonide (ENTOCORT EC) 3 MG 24 hr capsule Take 3 capsules (9 mg total) by mouth daily. 90 capsule 1  . Calcium Carb-Cholecalciferol (CALCIUM 1000 + D PO) Take 1 capsule by mouth daily.    Marland Kitchen levothyroxine (SYNTHROID, LEVOTHROID) 100 MCG tablet Take 100 mcg by mouth daily before breakfast.    . Multiple Vitamins-Minerals (MULTIVITAMIN WITH MINERALS) tablet Take 1 tablet by mouth daily.     No current facility-administered medications for this visit.   Allergies: Fentanyl and Oxycodone  No LMP recorded. Patient is postmenopausal.  Past medical history,surgical history, problem list, medications, allergies, family history and social history were all reviewed and documented as reviewed in the EPIC chart.  GYN ROS: no abnormal bleeding, pelvic pain or discharge, no breast pain or new or enlarging lumps on self exam.  No dysuria, urinary frequency, pain with urination, cloudy/malodorous urine.   OBJECTIVE:  BP 140/88   Ht 5' 8.5" (1.74 m)   Wt 146 lb (66.2 kg)   BMI 21.88 kg/m  The patient appears well, alert, oriented, in no distress.  BREAST EXAM: breasts appear normal, no suspicious masses, no skin or nipple changes or axillary nodes  PELVIC EXAM: VULVA: normal appearing vulva with atrophic change, no masses, tenderness or lesions, VAGINA: normal appearing vagina with atrophic change, normal color and discharge, no lesions, CERVIX: normal appearing atrophic cervix without discharge or lesions, UTERUS: uterus is normal size, shape, consistency and nontender, ADNEXA: normal adnexa in size, nontender and no masses  Chaperone: [name] present during the examination  ASSESSMENT:  74 y.o.  [id number] here for a breast and pelvic exam  PLAN:   1. Postmenopausal.  No significant hot flashes or night sweats.  No vaginal bleeding. 2. Pap smear 07/2017.  Remote history of cryosurgery many years back and normal Pap smears since then.  We discussed current screening guidelines and based on age criteria she is comfortable not continuing with Pap smears at this point.   3. Mammogram 04/2020.  Normal breast exam today.  Will continue with annual mammograms.   4. Colonoscopy 2018.  She will follow up at the recommended interval.  5. DEXA 08/2019.   Next DEXA recommended at 5 year interval.  6. Health maintenance.  No labs today as she normally has these completed elsewhere.  Blood pressure slightly elevated today but she notes that she recently has been diagnosed with whitecoat hypertension and has checked her blood pressure at home and has been totally normal.  Return annually or sooner, prn.  Joseph Pierini MD 09/15/20

## 2020-10-06 ENCOUNTER — Ambulatory Visit: Payer: Medicare Other | Admitting: Gastroenterology

## 2020-10-06 ENCOUNTER — Encounter: Payer: Self-pay | Admitting: Gastroenterology

## 2020-10-06 VITALS — BP 146/70 | HR 84 | Ht 68.5 in | Wt 143.2 lb

## 2020-10-06 DIAGNOSIS — R197 Diarrhea, unspecified: Secondary | ICD-10-CM

## 2020-10-06 DIAGNOSIS — K52832 Lymphocytic colitis: Secondary | ICD-10-CM | POA: Diagnosis not present

## 2020-10-06 MED ORDER — BUDESONIDE 3 MG PO CPEP: 9.0000 mg | ORAL_CAPSULE | Freq: Every day | ORAL | 1 refills | Status: DC

## 2020-10-06 NOTE — Progress Notes (Signed)
Vanessa Larsen    [id number]    1945-11-14  Primary Care Physician:Shaw, Chrissie Noa, MD  Referring Physician: Minyon Clan, MD [address] [place],  [place] [address]   Chief complaint:  Lymphocytic colitis  HPI:  38 yr very pleasant female with history of lymphocytic colitis here for follow-up visit for recurrent diarrhea  She developed recurrent diarrhea once she came off budesonide taper.  No improvement with Pepto-Bismol.  She had tried cholestyramine in the past in 2018 with no improvement  She was started back on budesonide taper and her diarrhea has significantly improved.  She is currently having regular bowel movement.  Father had pancreatic cancer, he was a chronic smoker.  Mother had esophageal cancer She is worried about possible pancreatic cancer.  Denies any abdominal pain, loss of appetite or weight loss  No new medications, avoids excessive use of NSAIDs  GI pathogen panel negative for infectious etiology  Normal stool fat, negative for pancreatic insufficiency    Outpatient Encounter Medications as of 10/06/2020  Medication Sig  . atorvastatin (LIPITOR) 20 MG tablet Take 20 mg by mouth every evening.   . budesonide (ENTOCORT EC) 3 MG 24 hr capsule Take 3 capsules (9 mg total) by mouth daily.  . Calcium Carb-Cholecalciferol (CALCIUM 1000 + D PO) Take 1 capsule by mouth daily.  Marland Kitchen levothyroxine (SYNTHROID, LEVOTHROID) 100 MCG tablet Take 100 mcg by mouth daily before breakfast.  . Multiple Vitamins-Minerals (MULTIVITAMIN WITH MINERALS) tablet Take 1 tablet by mouth daily.   No facility-administered encounter medications on file as of 10/06/2020.    Allergies as of 10/06/2020 - Review Complete 10/06/2020  Allergen Reaction Noted  . Fentanyl Nausea And Vomiting 06/30/2016  . Oxycodone  08/21/2018    Past Medical History:  Diagnosis Date  . Anal fissure   . Arthritis   . Elevated cholesterol   . Hyperplastic colon polyp   .  Hypothyroidism   . IBS (irritable bowel syndrome)   . Internal hemorrhoids    Small  . Microscopic colitis   . Osteoarthritis   . Ovarian cyst, left   . PONV (postoperative nausea and vomiting)   . Vasovagal syncope    very easily, on airplanes (trans atlanic), after sleeping    Past Surgical History:  Procedure Laterality Date  . BREAST BIOPSY Left    neg  . CATARACT EXTRACTION, BILATERAL    . COLONOSCOPY    . COLPOSCOPY    . EYE SURGERY     cosmetic  . GYNECOLOGIC CRYOSURGERY     age 14  . TOTAL KNEE ARTHROPLASTY Right 04/16/2018   Procedure: RIGHT TOTAL KNEE ARTHROPLASTY;  Surgeon: Ollen Gross, MD;  Location: [facility];  Service: Orthopedics;  Laterality: Right;  . UPPER GI ENDOSCOPY      Family History  Problem Relation Age of Onset  . Pancreatic cancer Father   . Heart disease Father   . Esophageal cancer Mother   . Diabetes Cousin   . Heart disease Paternal Grandmother   . Peptic Ulcer Sister   . Gallstones Sister   . Colon cancer Neg Hx   . Breast cancer Neg Hx     Social History   Socioeconomic History  . Marital status: Married    Spouse name: Not on file  . Number of children: 3  . Years of education: Not on file  . Highest education level: Not on file  Occupational History  . Occupation: homemaker  Tobacco Use  . Smoking status: Former Smoker    Quit date: 10/04/1971    Years since quitting: 49.0  . Smokeless tobacco: Never Used  Vaping Use  . Vaping Use: Never used  Substance and Sexual Activity  . Alcohol use: Yes    Alcohol/week: 14.0 standard drinks    Types: 14 Standard drinks or equivalent per week    Comment: 2 drinks per day  . Drug use: No  . Sexual activity: Yes    Birth control/protection: Post-menopausal    Comment: 1st intercourse 75 yo-Fewer than 5 partners  Other Topics Concern  . Not on file  Social History Narrative  . Not on file   Social Determinants of Health   Financial Resource Strain: Not on file  Food  Insecurity: Not on file  Transportation Needs: Not on file  Physical Activity: Not on file  Stress: Not on file  Social Connections: Not on file  Intimate Partner Violence: Not on file      Review of systems: All other review of systems negative except as mentioned in the HPI.   Physical Exam: Vitals:   10/06/20 1458  BP: (!) 146/70  Pulse: 84   Body mass index is 21.46 kg/m. Gen:      No acute distress Neuro: alert and oriented x 3 Psych: normal mood and affect  Data Reviewed:  Reviewed labs, radiology imaging, old records and pertinent past GI work up   Assessment and Plan/Recommendations:  75 year old very pleasant female with history of lymphocytic colitis, recurrent symptoms   Currently her symptoms have improved after restarting budesonide  Continue budesonide taper, plan to continue 9 mg daily of budesonide for 8 weeks followed by a slow taper for an additional 8 weeks  If continues to have recurrent symptoms, will consider repeat colonoscopy with biopsies to exclude possible underlying ulcerative colitis or Crohn's disease  Return in 3 months or sooner if needed  . The patient was provided an opportunity to ask questions and all were answered. The patient agreed with the plan and demonstrated an understanding of the instructions.  Damaris Hippo , MD    CC: Marton Redwood, MD

## 2020-10-06 NOTE — Patient Instructions (Signed)
We have refilled your budesonide today to continue taper:  Complete 9 mg daily Budesonide for 60 days  Then 6 mg daily for 3 weeks then decrease daily for 5 weeks then stop budesonide   Follow up in 3 months  If you are age 74 or older, your body mass index should be between 23-30. Your Body mass index is 21.46 kg/m. If this is out of the aforementioned range listed, please consider follow up with your Primary Care Provider.  If you are age 14 or younger, your body mass index should be between 19-25. Your Body mass index is 21.46 kg/m. If this is out of the aformentioned range listed, please consider follow up with your Primary Care Provider.    Due to recent changes in healthcare laws, you may see the results of your imaging and laboratory studies on MyChart before your provider has had a chance to review them.  We understand that in some cases there may be results that are confusing or concerning to you. Not all laboratory results come back in the same time frame and the provider may be waiting for multiple results in order to interpret others.  Please give [place] 48 hours in order for your provider to thoroughly review all the results before contacting the office for clarification of your results.   I appreciate the  opportunity to care for you  Thank You   Marsa Aris , MD

## 2020-10-20 ENCOUNTER — Encounter: Payer: Self-pay | Admitting: Gastroenterology

## 2020-12-15 ENCOUNTER — Other Ambulatory Visit (INDEPENDENT_AMBULATORY_CARE_PROVIDER_SITE_OTHER): Payer: Medicare Other

## 2020-12-15 ENCOUNTER — Other Ambulatory Visit: Payer: Self-pay | Admitting: *Deleted

## 2020-12-15 ENCOUNTER — Encounter: Payer: Self-pay | Admitting: Gastroenterology

## 2020-12-15 ENCOUNTER — Ambulatory Visit: Payer: Medicare Other | Admitting: Gastroenterology

## 2020-12-15 VITALS — BP 110/72 | HR 70 | Ht 68.5 in | Wt 143.0 lb

## 2020-12-15 DIAGNOSIS — R197 Diarrhea, unspecified: Secondary | ICD-10-CM | POA: Diagnosis not present

## 2020-12-15 DIAGNOSIS — K52832 Lymphocytic colitis: Secondary | ICD-10-CM

## 2020-12-15 LAB — COMPREHENSIVE METABOLIC PANEL
ALT: 15 U/L (ref 0–35)
AST: 19 U/L (ref 0–37)
Albumin: 4.5 g/dL (ref 3.5–5.2)
Alkaline Phosphatase: 44 U/L (ref 39–117)
BUN: 18 mg/dL (ref 6–23)
CO2: 30 mEq/L (ref 19–32)
Calcium: 9.5 mg/dL (ref 8.4–10.5)
Chloride: 102 mEq/L (ref 96–112)
Creatinine, Ser: 0.68 mg/dL (ref 0.40–1.20)
GFR: 86.4 mL/min (ref >60.00)
Glucose, Bld: 100 mg/dL — ABNORMAL HIGH (ref 70–99)
Potassium: 3.5 mEq/L (ref 3.5–5.1)
Sodium: 139 mEq/L (ref 135–145)
Total Bilirubin: 0.5 mg/dL (ref 0.2–1.2)
Total Protein: 7.1 g/dL (ref 6.0–8.3)

## 2020-12-15 LAB — CBC WITH DIFFERENTIAL/PLATELET
Basophils Absolute: 0.1 10*3/uL (ref 0.0–0.1)
Basophils Relative: 0.9 % (ref 0.0–3.0)
Eosinophils Absolute: 0.1 10*3/uL (ref 0.0–0.7)
Eosinophils Relative: 1.3 % (ref 0.0–5.0)
HCT: 39.6 % (ref 36.0–46.0)
Hemoglobin: 13.5 g/dL (ref 12.0–15.0)
Lymphocytes Relative: 22.6 % (ref 12.0–46.0)
Lymphs Abs: 1.2 10*3/uL (ref 0.7–4.0)
MCHC: 34 g/dL (ref 30.0–36.0)
MCV: 94.2 fl (ref 78.0–100.0)
Monocytes Absolute: 0.4 10*3/uL (ref 0.1–1.0)
Monocytes Relative: 8.2 % (ref 3.0–12.0)
Neutro Abs: 3.6 10*3/uL (ref 1.4–7.7)
Neutrophils Relative %: 67 % (ref 43.0–77.0)
Platelets: 231 10*3/uL (ref 150.0–400.0)
RBC: 4.2 Mil/uL (ref 3.87–5.11)
RDW: 12.7 % (ref 11.5–15.5)
WBC: 5.4 10*3/uL (ref 4.0–10.5)

## 2020-12-15 LAB — SEDIMENTATION RATE: Sed Rate: 6 mm/hr (ref 0–30)

## 2020-12-15 LAB — HIGH SENSITIVITY CRP: CRP, High Sensitivity: 0.88 mg/L (ref 0.000–5.000)

## 2020-12-15 NOTE — Progress Notes (Signed)
Vanessa Larsen    [id number]    29-Mar-1946  Primary Care Physician:Shaw, Emily Filbert., MD  Referring Physician: Ginger Organ., MD [address] [place],  [place] [address]   Chief complaint: Lymphocytic colitis  HPI:  32 yr very pleasant female with history of lymphocytic colitis here for follow-up visit.  She is currently taking budesonide 3 mg daily, she feels her stool is not as formed as it was when she was taking 9 mg daily.  She is also having increased bowel frequency and had 2-3 episodes of nocturnal diarrhea that woke her up from sleep in the past few weeks.  She did not have any symptoms of nocturnal diarrhea when she was taking budesonide 9 mg and also when she was taking 6 mg daily though her stool is less formed with lower dose of 6 mg daily.  No identifiable triggers.  No new medications.  She developed recurrent diarrhea once she came off budesonide taper in December 2021.  No improvement with Pepto-Bismol.  She had tried cholestyramine in the past in 2018 with no improvement  She was started back on budesonide taper and her diarrhea has significantly improved once she started taking budesonide 9 mg daily.   Father had pancreatic cancer, he was a chronic smoker. Mother had esophageal cancer She is worried about possible pancreatic cancer. Denies any abdominal pain, loss of appetite or weight loss  No new medications, avoids excessive use of NSAIDs  GI pathogen panel negative for infectious etiology  Normal stool fat, negative for pancreatic insufficiency    Outpatient Encounter Medications as of 12/15/2020  Medication Sig  . atorvastatin (LIPITOR) 20 MG tablet Take 20 mg by mouth every evening.   . budesonide (ENTOCORT EC) 3 MG 24 hr capsule Take 3 capsules (9 mg total) by mouth daily.  . Calcium Carb-Cholecalciferol (CALCIUM 1000 + D PO) Take 1 capsule by mouth daily.  Marland Kitchen levothyroxine (SYNTHROID, LEVOTHROID) 100 MCG tablet  Take 100 mcg by mouth daily before breakfast.  . Multiple Vitamins-Minerals (MULTIVITAMIN WITH MINERALS) tablet Take 1 tablet by mouth daily.   No facility-administered encounter medications on file as of 12/15/2020.    Allergies as of 12/15/2020 - Review Complete 10/20/2020  Allergen Reaction Noted  . Fentanyl Nausea And Vomiting 06/30/2016  . Oxycodone  08/21/2018    Past Medical History:  Diagnosis Date  . Anal fissure   . Arthritis   . Elevated cholesterol   . Hyperplastic colon polyp   . Hypothyroidism   . IBS (irritable bowel syndrome)   . Internal hemorrhoids    Small  . Microscopic colitis   . Osteoarthritis   . Ovarian cyst, left   . PONV (postoperative nausea and vomiting)   . Vasovagal syncope    very easily, on airplanes (trans atlanic), after sleeping    Past Surgical History:  Procedure Laterality Date  . BREAST BIOPSY Left    neg  . CATARACT EXTRACTION, BILATERAL    . COLONOSCOPY    . COLPOSCOPY    . EYE SURGERY     cosmetic  . GYNECOLOGIC CRYOSURGERY     age 69  . TOTAL KNEE ARTHROPLASTY Right 04/16/2018   Procedure: RIGHT TOTAL KNEE ARTHROPLASTY;  Surgeon: Gaynelle Arabian, MD;  Location: [facility];  Service: Orthopedics;  Laterality: Right;  . UPPER GI ENDOSCOPY      Family History  Problem Relation Age  of Onset  . Pancreatic cancer Father   . Heart disease Father   . Esophageal cancer Mother   . Diabetes Cousin   . Heart disease Paternal Grandmother   . Peptic Ulcer Sister   . Gallstones Sister   . Colon cancer Neg Hx   . Breast cancer Neg Hx     Social History   Socioeconomic History  . Marital status: Married    Spouse name: Not on file  . Number of children: 3  . Years of education: Not on file  . Highest education level: Not on file  Occupational History  . Occupation: homemaker  Tobacco Use  . Smoking status: Former Smoker    Quit date: 10/04/1971    Years since quitting: 49.2  . Smokeless tobacco: Never Used  Vaping Use  .  Vaping Use: Never used  Substance and Sexual Activity  . Alcohol use: Yes    Alcohol/week: 14.0 standard drinks    Types: 14 Standard drinks or equivalent per week    Comment: 2 drinks per day  . Drug use: No  . Sexual activity: Yes    Birth control/protection: Post-menopausal    Comment: 1st intercourse 76 yo-Fewer than 5 partners  Other Topics Concern  . Not on file  Social History Narrative  . Not on file   Social Determinants of Health   Financial Resource Strain: Not on file  Food Insecurity: Not on file  Transportation Needs: Not on file  Physical Activity: Not on file  Stress: Not on file  Social Connections: Not on file  Intimate Partner Violence: Not on file      Review of systems: All other review of systems negative except as mentioned in the HPI.   Physical Exam: Vitals:   12/15/20 1119  BP: 110/72  Pulse: 70   Body mass index is 21.43 kg/m. Gen:      No acute distress HEENT:  sclera anicteric Abd:      soft, non-tender; no palpable masses, no distension Ext:    No edema Neuro: alert and oriented x 3 Psych: normal mood and affect  Data Reviewed:  Reviewed labs, radiology imaging, old records and pertinent past GI work up   Assessment and Plan/Recommendations:  75 year old very pleasant female with history of lymphocytic colitis, recurrent symptoms of increased bowel frequency and nocturnal diarrhea  We will plan to increase budesonide to 6 mg daily for additional 2 months and then taper to 3 mg daily  Check CBC, CMP, CRP, ESR and fecal lactoferrin  If she continues to have recurrent diarrhea once budesonide is tapered, will plan to proceed with colonoscopy with biopsies to exclude ulcerative colitis or Crohn's disease.  In few rare cases lymphocytic colitis can progress to inflammatory bowel disease.  Return in 3 months or sooner if needed  This visit required 40 minutes of patient care (this includes precharting, chart review, review of  results, face-to-face time used for counseling as well as treatment plan and follow-up. The patient was provided an opportunity to ask questions and all were answered. The patient agreed with the plan and demonstrated an understanding of the instructions.  Damaris Hippo , MD    CC: Ginger Organ., MD

## 2020-12-15 NOTE — Patient Instructions (Signed)
Your provider has requested that you go to the basement level for lab work before leaving today. Press "B" on the elevator. The lab is located at the first door on the left as you exit the elevator.  Increase Budesonide 6 mg daily for  2 months then decrease to 3 mg for 1 month   Follow up in 3 months  Thank You for choosing [facility]  Harl Bowie, MD

## 2020-12-17 ENCOUNTER — Ambulatory Visit: Payer: Medicare Other | Admitting: Gastroenterology

## 2020-12-17 ENCOUNTER — Other Ambulatory Visit: Payer: Medicare Other

## 2020-12-17 DIAGNOSIS — R197 Diarrhea, unspecified: Secondary | ICD-10-CM

## 2020-12-17 DIAGNOSIS — K52832 Lymphocytic colitis: Secondary | ICD-10-CM | POA: Diagnosis not present

## 2020-12-18 LAB — FECAL LACTOFERRIN, QUANT
Fecal Lactoferrin: NEGATIVE
MICRO NUMBER:: 11659912
SPECIMEN QUALITY:: ADEQUATE

## 2021-02-03 ENCOUNTER — Telehealth: Payer: Self-pay

## 2021-02-03 NOTE — Telephone Encounter (Signed)
Pateitn advised. Will need an appointment the week of June 27th.

## 2021-02-03 NOTE — Telephone Encounter (Signed)
Patient will taper her Budesonide to 6 mg on 02/15/21 and 3 mg 03/18/21. She is having soft formed bowel movements, though she says she does not know what is really normal for her anymore. She is trying to schedule the 3 month follow up as was planned in March. No openings on the schedule. Can she see an APP?

## 2021-02-03 NOTE — Telephone Encounter (Signed)
Ok to schedule appt with APP. If she notices change in bowel habits, will consider longer taper.

## 2021-02-05 ENCOUNTER — Ambulatory Visit: Payer: Medicare Other | Admitting: Gastroenterology

## 2021-02-10 NOTE — Telephone Encounter (Signed)
Spoke with patient, she has been scheduled to see Alonza Bogus, PA on Tuesday, 03/30/21 at 10:00 AM. Patient had no concerns at the end of the call.

## 2021-02-15 ENCOUNTER — Other Ambulatory Visit: Payer: Self-pay | Admitting: Gastroenterology

## 2021-03-02 ENCOUNTER — Other Ambulatory Visit: Payer: Self-pay | Admitting: Internal Medicine

## 2021-03-02 DIAGNOSIS — Z1231 Encounter for screening mammogram for malignant neoplasm of breast: Secondary | ICD-10-CM

## 2021-03-30 ENCOUNTER — Ambulatory Visit: Payer: Medicare Other | Admitting: Gastroenterology

## 2021-03-30 ENCOUNTER — Encounter: Payer: Self-pay | Admitting: Gastroenterology

## 2021-03-30 VITALS — BP 148/82 | HR 80 | Ht 68.5 in | Wt 144.0 lb

## 2021-03-30 DIAGNOSIS — K52832 Lymphocytic colitis: Secondary | ICD-10-CM | POA: Diagnosis not present

## 2021-03-30 NOTE — Patient Instructions (Signed)
If you are age 74 or older, your body mass index should be between 23-30. Your Body mass index is 21.58 kg/m. If this is out of the aforementioned range listed, please consider follow up with your Primary Care Provider.  If you are age 65 or younger, your body mass index should be between 19-25. Your Body mass index is 21.58 kg/m. If this is out of the aformentioned range listed, please consider follow up with your Primary Care Provider.   Start Benefiber or Citrucel 2 teaspoons in 8 ounces of liquid daily and may increase to twice daily if tolerated.   Follow up as needed.  The [facility] providers would like to encourage you to use [facility] to communicate with providers for non-urgent requests or questions.  Due to long hold times on the telephone, sending your provider a message by [facility] may be a faster and more efficient way to get a response.  Please allow 48 business hours for a response.  Please remember that this is for non-urgent requests.   Thank you for choosing me and [facility].  Alonza Bogus, PA-C

## 2021-03-30 NOTE — Progress Notes (Signed)
03/30/2021 Vanessa Larsen [id number] 07/13/1946   HISTORY OF PRESENT ILLNESS: This is a 75 year old female who is a patient Dr. Woodward Ku.  She is here for follow-up of her lymphocytic colitis.  She was last seen here in March at which time she was still having some symptoms on Entocort 3 mg daily after tapering.  She went back to 6 mg daily for 2 months and then tapered back down to 3 mg daily.  She has now been off of the budesonide altogether for about 10 days.  Overall is doing well at this point.  Only having about 1 or 2 bowel movements in the mornings.  No nocturnal stools.   Past Medical History:  Diagnosis Date   Anal fissure    Arthritis    Elevated cholesterol    Hyperplastic colon polyp    Hypothyroidism    IBS (irritable bowel syndrome)    Internal hemorrhoids    Small   Microscopic colitis    Osteoarthritis    Ovarian cyst, left    PONV (postoperative nausea and vomiting)    Vasovagal syncope    very easily, on airplanes (trans atlanic), after sleeping   Past Surgical History:  Procedure Laterality Date   BREAST BIOPSY Left    neg   CATARACT EXTRACTION, BILATERAL     COLONOSCOPY     COLPOSCOPY     EYE SURGERY     cosmetic   GYNECOLOGIC CRYOSURGERY     age 54   TOTAL KNEE ARTHROPLASTY Right 04/16/2018   Procedure: RIGHT TOTAL KNEE ARTHROPLASTY;  Surgeon: Gaynelle Arabian, MD;  Location: [facility];  Service: Orthopedics;  Laterality: Right;   UPPER GI ENDOSCOPY      reports that she quit smoking about 49 years ago. Her smoking use included cigarettes. She has never used smokeless tobacco. She reports current alcohol use of about 14.0 standard drinks of alcohol per week. She reports that she does not use drugs. family history includes Diabetes in her cousin; Esophageal cancer in her mother; Gallstones in her sister; Heart disease in her father and paternal grandmother; Pancreatic cancer in her father; Peptic Ulcer in her sister. Allergies  Allergen  Reactions   Fentanyl Nausea And Vomiting   Oxycodone     Hypotension      Outpatient Encounter Medications as of 03/30/2021  Medication Sig   atorvastatin (LIPITOR) 20 MG tablet Take 20 mg by mouth every evening.    levothyroxine (SYNTHROID, LEVOTHROID) 100 MCG tablet Take 100 mcg by mouth daily before breakfast.   [DISCONTINUED] budesonide (ENTOCORT EC) 3 MG 24 hr capsule TAKE 3 CAPSULES DAILY   [DISCONTINUED] Calcium Carb-Cholecalciferol (CALCIUM 1000 + D PO) Take 1 capsule by mouth daily.   [DISCONTINUED] Multiple Vitamins-Minerals (MULTIVITAMIN WITH MINERALS) tablet Take 1 tablet by mouth daily.   No facility-administered encounter medications on file as of 03/30/2021.    REVIEW OF SYSTEMS  : All other systems reviewed and negative except where noted in the History of Present Illness.   PHYSICAL EXAM: BP (!) 148/82   Pulse 80   Ht 5' 8.5" (1.74 m)   Wt 144 lb (65.3 kg)   BMI 21.58 kg/m  General: Well developed white female in no acute distress Head: Normocephalic and atraumatic Eyes:  Sclerae anicteric, conjunctiva pink. Ears: Normal auditory acuity Lungs: Clear throughout to auscultation; no W/R/R. Heart: Regular rate and rhythm; no M/R/G. Abdomen: Soft, non-distended.  BS present.  Minimal lower abdominal TTP. Musculoskeletal: Symmetrical with no gross  deformities  Skin: No lesions on visible extremities Extremities: No edema  Neurological: Alert oriented x 4, grossly non-focal Psychological:  Alert and cooperative. Normal mood and affect  ASSESSMENT AND PLAN: *Lymphocytic colitis:  Now off of Budesonide for about 10 days and is doing well.  Likely has a component of underlying IBS as well.  Will continue off of budesonide for now.  Will call back for any recurrence of symptoms.  Could try Benefiber powder daily to try to help with bulking her stools if desired.  We discussed this as an option in the future but she will hold off for now.     CC:  Ginger Organ.,  MD

## 2021-04-27 ENCOUNTER — Other Ambulatory Visit: Payer: Self-pay

## 2021-04-27 ENCOUNTER — Ambulatory Visit
Admission: RE | Admit: 2021-04-27 | Discharge: 2021-04-27 | Disposition: A | Discharge status: Home / Self Care | Payer: Medicare Other | Source: Ambulatory Visit

## 2021-04-27 DIAGNOSIS — Z1231 Encounter for screening mammogram for malignant neoplasm of breast: Secondary | ICD-10-CM

## 2021-04-28 NOTE — Progress Notes (Signed)
Reviewed and agree with documentation and assessment and plan. K. Veena Jo-Ann Johanning , MD   

## 2021-06-29 DIAGNOSIS — R7301 Impaired fasting glucose: Secondary | ICD-10-CM | POA: Diagnosis not present

## 2021-07-09 DIAGNOSIS — R82998 Other abnormal findings in urine: Secondary | ICD-10-CM | POA: Diagnosis not present

## 2021-07-09 DIAGNOSIS — I10 Essential (primary) hypertension: Secondary | ICD-10-CM | POA: Diagnosis not present

## 2021-09-03 DIAGNOSIS — H5211 Myopia, right eye: Secondary | ICD-10-CM | POA: Diagnosis not present

## 2021-10-19 DIAGNOSIS — L814 Other melanin hyperpigmentation: Secondary | ICD-10-CM | POA: Diagnosis not present

## 2021-10-19 DIAGNOSIS — L570 Actinic keratosis: Secondary | ICD-10-CM | POA: Diagnosis not present

## 2021-10-19 DIAGNOSIS — L603 Nail dystrophy: Secondary | ICD-10-CM | POA: Diagnosis not present

## 2021-10-19 DIAGNOSIS — L821 Other seborrheic keratosis: Secondary | ICD-10-CM | POA: Diagnosis not present

## 2021-10-27 NOTE — Progress Notes (Signed)
76 y.o. G25P3003 Married Caucasian female here for annual exam.    Asking about the frequency of visits.   Notices some urinary odor.  Sometimes has infections.  No pain with urination.   History of postmenopausal bleeding.  Evaluation in 2020 with EMB showing atrophy.  Sonohysterogram:  no uterine masses. Right ovary with 12 x 5 mm cyst and left ovary with 7 x 9 mm thick walled cyst, both previously seen on [place].  PCP:  Janalyn Rouse, MD   No LMP recorded. Patient is postmenopausal.           Sexually active: No.  The current method of family planning is post menopausal status.    Exercising: Yes.     Walking and golf Smoker:  Former  Health Maintenance: Pap:  07-24-17 Neg, 06-18-14 Neg History of abnormal Pap:  yes, Hx of cryotherapy 25 - 30 years ago and normal paps since. MMG:  04-27-21 Neg/Birads1 Colonoscopy:  2018 normal;next 10 years BMD:  08/2019  Result : Normal TDaP:  PCP Gardasil:   no HIV:no Hep C: no Screening Labs:  PCP   reports that she quit smoking about 50 years ago. Her smoking use included cigarettes. She has never used smokeless tobacco. She reports current alcohol use of about 14.0 standard drinks per week. She reports that she does not use drugs.  Past Medical History:  Diagnosis Date   Anal fissure    Arthritis    Elevated cholesterol    Hyperplastic colon polyp    Hypothyroidism    IBS (irritable bowel syndrome)    Internal hemorrhoids    Small   Microscopic colitis    Osteoarthritis    Ovarian cyst, left    PONV (postoperative nausea and vomiting)    Vasovagal syncope    very easily, on airplanes (trans atlanic), after sleeping    Past Surgical History:  Procedure Laterality Date   BREAST BIOPSY Left    neg   CATARACT EXTRACTION, BILATERAL     COLONOSCOPY     COLPOSCOPY     EYE SURGERY     cosmetic   GYNECOLOGIC CRYOSURGERY     age 28   TOTAL KNEE ARTHROPLASTY Right 04/16/2018   Procedure: RIGHT TOTAL KNEE ARTHROPLASTY;  Surgeon:  Gaynelle Arabian, MD;  Location: [facility];  Service: Orthopedics;  Laterality: Right;   UPPER GI ENDOSCOPY      Current Outpatient Medications  Medication Sig Dispense Refill   atorvastatin (LIPITOR) 20 MG tablet Take 20 mg by mouth every evening.      levothyroxine (SYNTHROID, LEVOTHROID) 100 MCG tablet Take 100 mcg by mouth daily before breakfast.     mupirocin ointment (BACTROBAN) 2 % SMARTSIG:Sparingly Topical Daily     ondansetron (ZOFRAN) 4 MG tablet Take 1 tablet po q 6 hours prn     terbinafine (LAMISIL) 250 MG tablet Take 250 mg by mouth daily.     No current facility-administered medications for this visit.    Family History  Problem Relation Age of Onset   Esophageal cancer Mother    Pancreatic cancer Father    Heart disease Father    Peptic Ulcer Sister    Gallstones Sister    Cancer Maternal Aunt        uterine   Heart disease Paternal Grandmother    Diabetes Cousin    Colon cancer Neg Hx    Breast cancer Neg Hx     Review of Systems  All other systems reviewed and are negative.  Exam:   BP 134/82    Pulse 89    Ht 5' 8.5" (1.74 m)    Wt 140 lb (63.5 kg)    SpO2 98%    BMI 20.98 kg/m     General appearance: alert, cooperative and appears stated age Head: normocephalic, without obvious abnormality, atraumatic Neck: no adenopathy, supple, symmetrical, trachea midline and thyroid normal to inspection and palpation Lungs: clear to auscultation bilaterally Breasts: normal appearance, no masses or tenderness, No nipple retraction or dimpling, No nipple discharge or bleeding, No axillary adenopathy Heart: regular rate and rhythm Abdomen: soft, non-tender; no masses, no organomegaly Extremities: extremities normal, atraumatic, no cyanosis or edema Skin: skin color, texture, turgor normal. No rashes or lesions Lymph nodes: cervical, supraclavicular, and axillary nodes normal. Neurologic: grossly normal  Pelvic: External genitalia:  no lesions              No abnormal  inguinal nodes palpated.              Urethra:  normal appearing urethra with no masses, tenderness or lesions              Bartholins and Skenes: normal                 Vagina: normal appearing vagina with normal color and discharge, no lesions              Cervix: no lesions              Pap taken: yes Bimanual Exam:  Uterus:  normal size, contour, position, consistency, mobility, non-tender              Adnexa: no mass, fullness, tenderness              Rectal exam: yes.  Confirms.              Anus:  normal sphincter tone, no lesions  Chaperone was present for exam:  Raquel Sarna, RN  Assessment:   Well woman visit with gynecologic exam. Remote history of cryotherapy. Elevated blood pressure.  Repeat BP normal. Hx urinary odor. Health education encounter.   Plan: Mammogram screening discussed. Self breast awareness reviewed. Pap and HR HPV as above. Guidelines for Calcium, Vitamin D, regular exercise program including cardiovascular and weight bearing exercise. Increase hydration and call office for dysuria or vaginal odor.  Cancer screening guidelines reviewed.  Next breast and pelvic exam in 2 years and follow up prn.   After visit summary provided.   20 min  total time was spent for this patient encounter, including preparation, face-to-face counseling with the patient, coordination of care, and documentation of the encounter.

## 2021-10-28 ENCOUNTER — Ambulatory Visit (INDEPENDENT_AMBULATORY_CARE_PROVIDER_SITE_OTHER): Payer: Medicare Other | Admitting: Obstetrics and Gynecology

## 2021-10-28 ENCOUNTER — Encounter: Payer: Self-pay | Admitting: Obstetrics and Gynecology

## 2021-10-28 ENCOUNTER — Other Ambulatory Visit (HOSPITAL_COMMUNITY)
Admission: RE | Admit: 2021-10-28 | Discharge: 2021-10-28 | Disposition: A | Discharge status: Home / Self Care | Payer: Medicare Other | Source: Ambulatory Visit | Attending: Obstetrics and Gynecology | Admitting: Obstetrics and Gynecology

## 2021-10-28 ENCOUNTER — Other Ambulatory Visit: Payer: Self-pay

## 2021-10-28 VITALS — BP 134/82 | HR 89 | Ht 68.5 in | Wt 140.0 lb

## 2021-10-28 DIAGNOSIS — Z01419 Encounter for gynecological examination (general) (routine) without abnormal findings: Secondary | ICD-10-CM

## 2021-10-28 DIAGNOSIS — Z124 Encounter for screening for malignant neoplasm of cervix: Secondary | ICD-10-CM | POA: Insufficient documentation

## 2021-10-28 DIAGNOSIS — R300 Dysuria: Secondary | ICD-10-CM | POA: Diagnosis not present

## 2021-10-28 DIAGNOSIS — Z719 Counseling, unspecified: Secondary | ICD-10-CM

## 2021-10-28 NOTE — Patient Instructions (Signed)

## 2021-11-01 LAB — CYTOLOGY - PAP: Diagnosis: NEGATIVE

## 2021-11-04 DIAGNOSIS — Z85828 Personal history of other malignant neoplasm of skin: Secondary | ICD-10-CM | POA: Diagnosis not present

## 2021-11-04 DIAGNOSIS — C44729 Squamous cell carcinoma of skin of left lower limb, including hip: Secondary | ICD-10-CM | POA: Diagnosis not present

## 2021-11-25 ENCOUNTER — Telehealth: Payer: Self-pay | Admitting: Gastroenterology

## 2021-11-25 NOTE — Telephone Encounter (Signed)
Patient called in with complaints of diarrhea for over a week, going up to 10 times/day. She was recently prescribed terbinafine for a nail fungus at the earlier part of the month, and she's not sure if that has something to do with it or if it is a colitis flare up. No relief with imodium, and she is currently in [place] till Saturday. She would like to know there is anything else she could try. Please advise, thank you.

## 2021-11-25 NOTE — Telephone Encounter (Signed)
Patient called states she was given Terbinafine and she is wanting to speak with a nurse here about it.

## 2021-11-26 ENCOUNTER — Other Ambulatory Visit: Payer: Self-pay

## 2021-11-26 DIAGNOSIS — R197 Diarrhea, unspecified: Secondary | ICD-10-CM

## 2021-11-26 DIAGNOSIS — R1084 Generalized abdominal pain: Secondary | ICD-10-CM

## 2021-11-26 NOTE — Telephone Encounter (Signed)
Spoke with patient regarding recommendations. C diff lab ordered, and patient will come in on Monday to pick up kit.

## 2021-11-26 NOTE — Telephone Encounter (Signed)
Patient is not currently on budesonide, her last dose was back in June 2022. Patient advised to come in for stool studies on Monday if symptoms persist. She is concerned about the long drive home tomorrow, and is wondering how often she can take the imodium or if there are other alternatives? Please advise, thank you.

## 2021-11-29 ENCOUNTER — Other Ambulatory Visit: Payer: Medicare Other

## 2021-11-29 DIAGNOSIS — R1084 Generalized abdominal pain: Secondary | ICD-10-CM

## 2021-11-29 DIAGNOSIS — R197 Diarrhea, unspecified: Secondary | ICD-10-CM | POA: Diagnosis not present

## 2021-11-30 ENCOUNTER — Other Ambulatory Visit: Payer: Self-pay

## 2021-11-30 DIAGNOSIS — L609 Nail disorder, unspecified: Secondary | ICD-10-CM | POA: Diagnosis not present

## 2021-11-30 DIAGNOSIS — L603 Nail dystrophy: Secondary | ICD-10-CM | POA: Diagnosis not present

## 2021-11-30 LAB — CLOSTRIDIUM DIFFICILE TOXIN B, QUALITATIVE, REAL-TIME PCR: Toxigenic C. Difficile by PCR: NOT DETECTED

## 2021-11-30 MED ORDER — BUDESONIDE 3 MG PO CPEP: 9.0000 mg | ORAL_CAPSULE | Freq: Every day | ORAL | 0 refills | Status: AC

## 2021-11-30 MED ORDER — BUDESONIDE 3 MG PO CPEP: 9.0000 mg | ORAL_CAPSULE | Freq: Every day | ORAL | 0 refills | Status: DC

## 2021-12-01 ENCOUNTER — Telehealth: Payer: Self-pay | Admitting: Gastroenterology

## 2021-12-01 NOTE — Telephone Encounter (Signed)
Inbound call from patient states PA is needed for budesonide ?

## 2021-12-03 NOTE — Telephone Encounter (Signed)
Prior Authorization for Budesonide done 12/03/21.  ?

## 2021-12-17 NOTE — Telephone Encounter (Signed)
Patients Budesonide Approved until 12-03-2021 untl 12/10/2021 ?

## 2022-02-02 ENCOUNTER — Other Ambulatory Visit (INDEPENDENT_AMBULATORY_CARE_PROVIDER_SITE_OTHER): Payer: Medicare Other

## 2022-02-02 ENCOUNTER — Encounter: Payer: Self-pay | Admitting: Gastroenterology

## 2022-02-02 ENCOUNTER — Ambulatory Visit: Payer: Medicare Other | Admitting: Gastroenterology

## 2022-02-02 VITALS — BP 140/80 | HR 81 | Ht 69.0 in | Wt 139.0 lb

## 2022-02-02 DIAGNOSIS — K529 Noninfective gastroenteritis and colitis, unspecified: Secondary | ICD-10-CM

## 2022-02-02 DIAGNOSIS — K52832 Lymphocytic colitis: Secondary | ICD-10-CM

## 2022-02-02 LAB — CBC WITH DIFFERENTIAL/PLATELET
Basophils Absolute: 0.1 10*3/uL (ref 0.0–0.1)
Basophils Relative: 1.3 % (ref 0.0–3.0)
Eosinophils Absolute: 0.2 10*3/uL (ref 0.0–0.7)
Eosinophils Relative: 4.5 % (ref 0.0–5.0)
HCT: 40.6 % (ref 36.0–46.0)
Hemoglobin: 13.6 g/dL (ref 12.0–15.0)
Lymphocytes Relative: 24.2 % (ref 12.0–46.0)
Lymphs Abs: 1.1 10*3/uL (ref 0.7–4.0)
MCHC: 33.5 g/dL (ref 30.0–36.0)
MCV: 95.9 fl (ref 78.0–100.0)
Monocytes Absolute: 0.5 10*3/uL (ref 0.1–1.0)
Monocytes Relative: 11.2 % (ref 3.0–12.0)
Neutro Abs: 2.7 10*3/uL (ref 1.4–7.7)
Neutrophils Relative %: 58.8 % (ref 43.0–77.0)
Platelets: 243 10*3/uL (ref 150.0–400.0)
RBC: 4.24 Mil/uL (ref 3.87–5.11)
RDW: 13.6 % (ref 11.5–15.5)
WBC: 4.6 10*3/uL (ref 4.0–10.5)

## 2022-02-02 LAB — COMPREHENSIVE METABOLIC PANEL
ALT: 16 U/L (ref 0–35)
AST: 19 U/L (ref 0–37)
Albumin: 4.6 g/dL (ref 3.5–5.2)
Alkaline Phosphatase: 49 U/L (ref 39–117)
BUN: 18 mg/dL (ref 6–23)
CO2: 30 mEq/L (ref 19–32)
Calcium: 9.7 mg/dL (ref 8.4–10.5)
Chloride: 100 mEq/L (ref 96–112)
Creatinine, Ser: 0.73 mg/dL (ref 0.40–1.20)
GFR: 80.94 mL/min (ref >60.00)
Glucose, Bld: 72 mg/dL (ref 70–99)
Potassium: 3.9 mEq/L (ref 3.5–5.1)
Sodium: 137 mEq/L (ref 135–145)
Total Bilirubin: 0.5 mg/dL (ref 0.2–1.2)
Total Protein: 7.2 g/dL (ref 6.0–8.3)

## 2022-02-02 LAB — IBC + FERRITIN
Ferritin: 118.3 ng/mL (ref 10.0–291.0)
Iron: 107 ug/dL (ref 42–145)
Saturation Ratios: 26.7 % (ref 20.0–50.0)
TIBC: 400.4 ug/dL (ref 250.0–450.0)
Transferrin: 286 mg/dL (ref 212.0–360.0)

## 2022-02-02 LAB — VITAMIN B12: Vitamin B-12: 497 pg/mL (ref 211–911)

## 2022-02-02 LAB — FOLATE: Folate: >23.5 ng/mL (ref >5.9)

## 2022-02-02 MED ORDER — BUDESONIDE 3 MG PO CPEP: 9.0000 mg | ORAL_CAPSULE | Freq: Every day | ORAL | 2 refills | Status: DC

## 2022-02-02 NOTE — Patient Instructions (Addendum)
You have been scheduled for a colonoscopy. Please follow written instructions given to you at your visit today.  ?Please pick up your prep supplies at the pharmacy within the next 1-3 days. ?If you use inhalers (even only as needed), please bring them with you on the day of your procedure.  Miralax Prep ? ?We have sent the following medications to your pharmacy for you to pick up at your convenience:  Budesonide  ? ?You will take Budesonide 9 mg daily for 1 month, then decrease to 6 mg for 1 month then decrease to 3 mg for 1 month THen you can discuss with Dr Silverio Decamp at your follow up appointment ? ?If you are age 35 or older, your body mass index should be between 23-30. Your Body mass index is 20.53 kg/m?Marland Kitchen If this is out of the aforementioned range listed, please consider follow up with your Primary Care Provider. ? ?If you are age 40 or younger, your body mass index should be between 19-25. Your Body mass index is 20.53 kg/m?Marland Kitchen If this is out of the aformentioned range listed, please consider follow up with your Primary Care Provider.  ? ?________________________________________________________ ? ?The  GI providers would like to encourage you to use [facility] to communicate with providers for non-urgent requests or questions.  Due to long hold times on the telephone, sending your provider a message by [facility] may be a faster and more efficient way to get a response.  Please allow 48 business hours for a response.  Please remember that this is for non-urgent requests.  ?_______________________________________________________  ? ?Due to recent changes in healthcare laws, you may see the results of your imaging and laboratory studies on MyChart before your provider has had a chance to review them.  We understand that in some cases there may be results that are confusing or concerning to you. Not all laboratory results come back in the same time frame and the provider may be waiting for multiple results in order  to interpret others.  Please give [place] 48 hours in order for your provider to thoroughly review all the results before contacting the office for clarification of your results.   ? ?I appreciate the  opportunity to care for you ? ?Thank You  ? ?Harl Bowie , MD  ?

## 2022-02-02 NOTE — Progress Notes (Signed)
? ?       ? ?Vanessa Larsen    [id number]    January 20, 1946 ? ?Primary Care Physician:Shaw, Emily Filbert., MD ? ?Referring Physician: Ginger Organ., MD ?[address] ?[place],  [place] [address] ? ? ?Chief complaint: Chronic diarrhea, history of lymphocytic colitis ? ?HPI: ? ?66 yr very pleasant female with history of lymphocytic colitis here for follow-up visit.  She developed recurrent diarrhea 6 to 8 weeks ago after she was started on terbinafine.  She was having multiple bowel movements, approximately 10 to 15/day and was also waking up 3-4 times in the night with fecal urgency. ?She has gone back on budesonide 9 mg daily, bowel habits have improved and currently is having 1-3 bowel movements per day, still not completely formed but overall feeling much better ? ?Denies any nausea, vomiting, abdominal pain, melena or bright red blood per rectum ? ? ? ?Outpatient Encounter Medications as of 02/02/2022  ?Medication Sig  ? atorvastatin (LIPITOR) 20 MG tablet Take 20 mg by mouth every evening.   ? levothyroxine (SYNTHROID, LEVOTHROID) 100 MCG tablet Take 100 mcg by mouth daily before breakfast.  ? mupirocin ointment (BACTROBAN) 2 % SMARTSIG:Sparingly Topical Daily  ? ondansetron (ZOFRAN) 4 MG tablet Take 1 tablet po q 6 hours prn  ? terbinafine (LAMISIL) 250 MG tablet Take 250 mg by mouth daily.  ? ?No facility-administered encounter medications on file as of 02/02/2022.  ? ? ?Allergies as of 02/02/2022 - Review Complete 10/28/2021  ?Allergen Reaction Noted  ? Fentanyl Nausea And Vomiting 06/30/2016  ? Nicotine polacrilex Other (See Comments) 07/09/2021  ? Oxycodone  08/21/2018  ? ? ?Past Medical History:  ?Diagnosis Date  ? Anal fissure   ? Arthritis   ? Elevated cholesterol   ? Hyperplastic colon polyp   ? Hypothyroidism   ? IBS (irritable bowel syndrome)   ? Internal hemorrhoids   ? Small  ? Microscopic colitis   ? Osteoarthritis   ? Ovarian cyst, left   ? PONV (postoperative nausea and vomiting)   ?  Vasovagal syncope   ? very easily, on airplanes (trans atlanic), after sleeping  ? ? ?Past Surgical History:  ?Procedure Laterality Date  ? BREAST BIOPSY Left   ? neg  ? CATARACT EXTRACTION, BILATERAL    ? COLONOSCOPY    ? COLPOSCOPY    ? EYE SURGERY    ? cosmetic  ? GYNECOLOGIC CRYOSURGERY    ? age 52  ? TOTAL KNEE ARTHROPLASTY Right 04/16/2018  ? Procedure: RIGHT TOTAL KNEE ARTHROPLASTY;  Surgeon: Gaynelle Arabian, MD;  Location: [facility];  Service: Orthopedics;  Laterality: Right;  ? UPPER GI ENDOSCOPY    ? ? ?Family History  ?Problem Relation Age of Onset  ? Esophageal cancer Mother   ? Pancreatic cancer Father   ? Heart disease Father   ? Peptic Ulcer Sister   ? Gallstones Sister   ? Cancer Maternal Aunt   ?     uterine  ? Heart disease Paternal Grandmother   ? Diabetes Cousin   ? Colon cancer Neg Hx   ? Breast cancer Neg Hx   ? ? ?Social History  ? ?Socioeconomic History  ? Marital status: Married  ?  Spouse name: Not on file  ? Number of children: 3  ? Years of education: Not on file  ? Highest education level: Not on file  ?Occupational History  ? Occupation: homemaker  ?Tobacco Use  ? Smoking status: Former  ?  Types: Cigarettes  ?  Quit date: 10/04/1971  ?  Years since quitting: 50.3  ? Smokeless tobacco: Never  ?Vaping Use  ? Vaping Use: Never used  ?Substance and Sexual Activity  ? Alcohol use: Yes  ?  Alcohol/week: 14.0 standard drinks  ?  Types: 14 Standard drinks or equivalent per week  ?  Comment: 2 drinks per day  ? Drug use: No  ? Sexual activity: Not Currently  ?  Birth control/protection: Post-menopausal  ?  Comment: 1st intercourse 76 yo-Fewer than 5 partners  ?Other Topics Concern  ? Not on file  ?Social History Narrative  ? Not on file  ? ?Social Determinants of Health  ? ?Financial Resource Strain: Not on file  ?Food Insecurity: Not on file  ?Transportation Needs: Not on file  ?Physical Activity: Not on file  ?Stress: Not on file  ?Social Connections: Not on file  ?Intimate Partner Violence: Not on  file  ? ? ? ? ?Review of systems: ?All other review of systems negative except as mentioned in the HPI. ? ? ?Physical Exam: ?There were no vitals filed for this visit. ?There is no height or weight on file to calculate BMI. ?Gen:      No acute distress ?HEENT:  sclera anicteric ?Abd:      soft, non-tender; no palpable masses, no distension ?Ext:    No edema ?Neuro: alert and oriented x 3 ?Psych: normal mood and affect ? ?Data Reviewed: ? ?Reviewed labs, radiology imaging, old records and pertinent past GI work up ? ? ?Assessment and Plan/Recommendations: ? ?76 year old very pleasant female with history of lymphocytic colitis, recurrent symptoms of increased bowel frequency and nocturnal diarrhea concerning for recurrent lymphocytic colitis but will need to exclude possible inflammatory bowel disease Crohn's or ulcerative colitis given frequent recurrent symptoms and steroid dependency ?  ?Diarrhea is improved on budesonide 9 mg daily, continue for additional 30 days followed by taper dose 6 mg daily for 30 days and then 3 mg daily for 30 days. ? ?We will plan for repeat colonoscopy with biopsies to exclude underlying inflammatory bowel disease ?The risks and benefits as well as alternatives of endoscopic procedure(s) have been discussed and reviewed. All questions answered. The patient agrees to proceed. ? ?Check CMP, CRP ?History of iron deficiency anemia: Follow-up CBC, iron panel, B12 and folate ? ?Return in 3 months after colonoscopy. ? ? The patient was provided an opportunity to ask questions and all were answered. The patient agreed with the plan and demonstrated an understanding of the instructions. ? ?K. Denzil Magnuson , MD ?  ? ?CC: Ginger Organ., MD ? ? ?

## 2022-02-08 ENCOUNTER — Encounter: Payer: Self-pay | Admitting: Gastroenterology

## 2022-03-16 ENCOUNTER — Encounter: Payer: Self-pay | Admitting: Gastroenterology

## 2022-03-23 ENCOUNTER — Ambulatory Visit (AMBULATORY_SURGERY_CENTER): Payer: Medicare Other | Admitting: Gastroenterology

## 2022-03-23 ENCOUNTER — Encounter: Payer: Self-pay | Admitting: Gastroenterology

## 2022-03-23 VITALS — BP 129/82 | HR 57 | Temp 98.0°F | Resp 12 | Ht 69.0 in | Wt 139.0 lb

## 2022-03-23 DIAGNOSIS — D123 Benign neoplasm of transverse colon: Secondary | ICD-10-CM | POA: Diagnosis not present

## 2022-03-23 DIAGNOSIS — Z884 Allergy status to anesthetic agent status: Secondary | ICD-10-CM | POA: Diagnosis not present

## 2022-03-23 DIAGNOSIS — K648 Other hemorrhoids: Secondary | ICD-10-CM

## 2022-03-23 DIAGNOSIS — K6389 Other specified diseases of intestine: Secondary | ICD-10-CM | POA: Diagnosis not present

## 2022-03-23 DIAGNOSIS — R197 Diarrhea, unspecified: Secondary | ICD-10-CM | POA: Diagnosis not present

## 2022-03-23 DIAGNOSIS — K52832 Lymphocytic colitis: Secondary | ICD-10-CM | POA: Diagnosis not present

## 2022-03-23 DIAGNOSIS — K529 Noninfective gastroenteritis and colitis, unspecified: Secondary | ICD-10-CM | POA: Diagnosis not present

## 2022-03-23 MED ORDER — SODIUM CHLORIDE 0.9 % IV SOLN: 500.0000 mL | Freq: Once | INTRAVENOUS | Status: DC

## 2022-03-23 NOTE — Progress Notes (Signed)
Called to room to assist during endoscopic procedure.  Patient ID and intended procedure confirmed with present staff. Received instructions for my participation in the procedure from the performing physician.  

## 2022-03-23 NOTE — Progress Notes (Signed)
VSS, transported to PACU °

## 2022-03-23 NOTE — Op Note (Signed)
[facility] Patient Name: Vanessa Larsen Procedure Date: 03/23/2022 2:37 PM MRN: [id number] Endoscopist: Mauri Pole , MD Age: 76 Referring MD:  Date of Birth: 23-Apr-1946 Gender: Female Account #: [id number] Procedure:                Colonoscopy Indications:              Follow-up of microscopic colitis, Chronic diarrhea Medicines:                Monitored Anesthesia Care Procedure:                Pre-Anesthesia Assessment:                           - Prior to the procedure, a History and Physical                            was performed, and patient medications and                            allergies were reviewed. The patient's tolerance of                            previous anesthesia was also reviewed. The risks                            and benefits of the procedure and the sedation                            options and risks were discussed with the patient.                            All questions were answered, and informed consent                            was obtained. Prior Anticoagulants: The patient has                            taken no previous anticoagulant or antiplatelet                            agents. ASA Grade Assessment: II - A patient with                            mild systemic disease. After reviewing the risks                            and benefits, the patient was deemed in                            satisfactory condition to undergo the procedure.                           After obtaining informed consent, the colonoscope  was passed under direct vision. Throughout the                            procedure, the patient's blood pressure, pulse, and                            oxygen saturations were monitored continuously. The                            Olympus PCF-H190DL (BD#[id number]) Colonoscope was                            introduced through the anus and advanced to the the                             cecum, identified by appendiceal orifice and                            ileocecal valve. The colonoscopy was performed                            without difficulty. The patient tolerated the                            procedure well. The quality of the bowel                            preparation was good. The ileocecal valve,                            appendiceal orifice, and rectum were photographed. Scope In: 2:45:14 PM Scope Out: 3:00:12 PM Scope Withdrawal Time: 0 hours 10 minutes 30 seconds  Total Procedure Duration: 0 hours 14 minutes 58 seconds  Findings:                 The perianal and digital rectal examinations were                            normal.                           Normal mucosa was found in the entire colon.                            Biopsies for histology were taken with a cold                            forceps from the entire colon for evaluation of                            microscopic colitis.                           Two sessile polyps were found in the transverse  colon. The polyps were 4 to 6 mm in size. These                            polyps were removed with a cold snare. Resection                            and retrieval were complete.                           Non-bleeding external and internal hemorrhoids were                            found during retroflexion. The hemorrhoids were                            small. Complications:            No immediate complications. Estimated Blood Loss:     Estimated blood loss was minimal. Impression:               - Normal mucosa in the entire examined colon.                            Biopsied.                           - Two 4 to 6 mm polyps in the transverse colon,                            removed with a cold snare. Resected and retrieved.                           - Non-bleeding external and internal hemorrhoids. Recommendation:           - Patient has a contact number  available for                            emergencies. The signs and symptoms of potential                            delayed complications were discussed with the                            patient. Return to normal activities tomorrow.                            Written discharge instructions were provided to the                            patient.                           - Resume previous diet.                           - Continue present medications.                           -  Await pathology results.                           - Repeat colonoscopy in 5-10 years for surveillance                            based on pathology results.                           - Return to GI clinic at the next available                            appointment in 2-3 months. Mauri Pole, MD 03/23/2022 3:05:05 PM This report has been signed electronically.

## 2022-03-23 NOTE — Progress Notes (Signed)
[facility] History and Physical   Primary Care Physician:  Ginger Organ., MD   Reason for Procedure:  Chronic diarrhea  Plan:    colonoscopy with possible interventions as needed     HPI: Vanessa Larsen is a very pleasant 76 y.o. female here for colonoscopy with biopsies for chronic diarrhea and lymphocytic colitis. Denies any nausea, vomiting, abdominal pain, melena or bright red blood per rectum  The risks and benefits as well as alternatives of endoscopic procedure(s) have been discussed and reviewed. All questions answered. The patient agrees to proceed.    Past Medical History:  Diagnosis Date   Anal fissure    Arthritis    Elevated cholesterol    Hyperplastic colon polyp    Hypothyroidism    IBS (irritable bowel syndrome)    Internal hemorrhoids    Small   Microscopic colitis    Osteoarthritis    Ovarian cyst, left    PONV (postoperative nausea and vomiting)    Vasovagal syncope    very easily, on airplanes (trans atlanic), after sleeping    Past Surgical History:  Procedure Laterality Date   BREAST BIOPSY Left    neg   CATARACT EXTRACTION, BILATERAL     COLONOSCOPY     COLPOSCOPY     EYE SURGERY     cosmetic   GYNECOLOGIC CRYOSURGERY     age 59   TOTAL KNEE ARTHROPLASTY Right 04/16/2018   Procedure: RIGHT TOTAL KNEE ARTHROPLASTY;  Surgeon: Gaynelle Arabian, MD;  Location: [facility];  Service: Orthopedics;  Laterality: Right;   UPPER GI ENDOSCOPY      Prior to Admission medications   Medication Sig Start Date End Date Taking? Authorizing Provider  atorvastatin (LIPITOR) 20 MG tablet Take 20 mg by mouth every evening.    Yes [provider]  budesonide (ENTOCORT EC) 3 MG 24 hr capsule Take 3 capsules (9 mg total) by mouth daily. 02/02/22  Yes Shabria Egley, Venia Minks, MD  levothyroxine (SYNTHROID, LEVOTHROID) 100 MCG tablet Take 100 mcg by mouth daily before breakfast.   Yes [provider]  mupirocin ointment (BACTROBAN)  2 % SMARTSIG:Sparingly Topical Daily 10/19/21   [provider]  ondansetron (ZOFRAN) 4 MG tablet Take 1 tablet po q 6 hours prn 05/06/21   [provider]    Current Outpatient Medications  Medication Sig Dispense Refill   atorvastatin (LIPITOR) 20 MG tablet Take 20 mg by mouth every evening.      budesonide (ENTOCORT EC) 3 MG 24 hr capsule Take 3 capsules (9 mg total) by mouth daily. 90 capsule 2   levothyroxine (SYNTHROID, LEVOTHROID) 100 MCG tablet Take 100 mcg by mouth daily before breakfast.     mupirocin ointment (BACTROBAN) 2 % SMARTSIG:Sparingly Topical Daily     ondansetron (ZOFRAN) 4 MG tablet Take 1 tablet po q 6 hours prn     Current Facility-Administered Medications  Medication Dose Route Frequency Provider Last Rate Last Admin   0.9 %  sodium chloride infusion  500 mL Intravenous Once Mauri Pole, MD        Allergies as of 03/23/2022 - Review Complete 03/23/2022  Allergen Reaction Noted   Fentanyl Nausea And Vomiting 06/30/2016   Nicotine polacrilex Other (See Comments) 07/09/2021   Oxycodone  08/21/2018    Family History  Problem Relation Age of Onset   Esophageal cancer Mother    Pancreatic cancer Father    Heart disease Father    Peptic Ulcer Sister  Gallstones Sister    Cancer Maternal Aunt        uterine   Heart disease Paternal Grandmother    Diabetes Cousin    Colon cancer Neg Hx    Breast cancer Neg Hx     Social History   Socioeconomic History   Marital status: Married    Spouse name: Not on file   Number of children: 3   Years of education: Not on file   Highest education level: Not on file  Occupational History   Occupation: homemaker  Tobacco Use   Smoking status: Former    Types: Cigarettes    Quit date: 10/04/1971    Years since quitting: 50.5   Smokeless tobacco: Never  Vaping Use   Vaping Use: Never used  Substance and Sexual Activity   Alcohol use: Yes    Alcohol/week: 14.0 standard drinks of alcohol     Types: 14 Standard drinks or equivalent per week    Comment: 2 drinks per day   Drug use: No   Sexual activity: Not Currently    Birth control/protection: Post-menopausal    Comment: 1st intercourse 76 yo-Fewer than 5 partners  Other Topics Concern   Not on file  Social History Narrative   Not on file   Social Determinants of Health   Financial Resource Strain: Not on file  Food Insecurity: Not on file  Transportation Needs: Not on file  Physical Activity: Not on file  Stress: Not on file  Social Connections: Not on file  Intimate Partner Violence: Not on file    Review of Systems:  All other review of systems negative except as mentioned in the HPI.  Physical Exam: Vital signs in last 24 hours: BP (!) 148/82   Pulse 72   Temp 98 F (36.7 C) (Temporal)   Ht '5\' 9"'$  (1.753 m)   Wt 139 lb (63 kg)   SpO2 97%   BMI 20.53 kg/m  General:   Alert, NAD Lungs:  Clear .   Heart:  Regular rate and rhythm Abdomen:  Soft, nontender and nondistended. Neuro/Psych:  Alert and cooperative. Normal mood and affect. A and O x 3  Reviewed labs, radiology imaging, old records and pertinent past GI work up  Patient is appropriate for planned procedure(s) and anesthesia in an ambulatory setting   K. Denzil Magnuson , MD [phone]

## 2022-03-23 NOTE — Progress Notes (Signed)
VS completed by DT.  Pt's states no medical or surgical changes since previsit or office visit.  

## 2022-03-23 NOTE — Patient Instructions (Signed)
Information on polyps and hemorrhoids given to you today.  Await pathology results.  Resume previous diet and medications.  Return to GI clinic at next available appointment.   YOU HAD AN ENDOSCOPIC PROCEDURE TODAY AT [facility]:   Refer to the procedure report that was given to you for any specific questions about what was found during the examination.  If the procedure report does not answer your questions, please call your gastroenterologist to clarify.  If you requested that your care partner not be given the details of your procedure findings, then the procedure report has been included in a sealed envelope for you to review at your convenience later.  YOU SHOULD EXPECT: Some feelings of bloating in the abdomen. Passage of more gas than usual.  Walking can help get rid of the air that was put into your GI tract during the procedure and reduce the bloating. If you had a lower endoscopy (such as a colonoscopy or flexible sigmoidoscopy) you may notice spotting of blood in your stool or on the toilet paper. If you underwent a bowel prep for your procedure, you may not have a normal bowel movement for a few days.  Please Note:  You might notice some irritation and congestion in your nose or some drainage.  This is from the oxygen used during your procedure.  There is no need for concern and it should clear up in a day or so.  SYMPTOMS TO REPORT IMMEDIATELY:  Following lower endoscopy (colonoscopy or flexible sigmoidoscopy):  Excessive amounts of blood in the stool  Significant tenderness or worsening of abdominal pains  Swelling of the abdomen that is new, acute  Fever of 100F or higher   For urgent or emergent issues, a gastroenterologist can be reached at any hour by calling [phone]. Do not use MyChart messaging for urgent concerns.    DIET:  We do recommend a small meal at first, but then you may proceed to your regular diet.  Drink plenty of fluids but you  should avoid alcoholic beverages for 24 hours.  ACTIVITY:  You should plan to take it easy for the rest of today and you should NOT DRIVE or use heavy machinery until tomorrow (because of the sedation medicines used during the test).    FOLLOW UP: Our staff will call the number listed on your records 24-72 hours following your procedure to check on you and address any questions or concerns that you may have regarding the information given to you following your procedure. If we do not reach you, we will leave a message.  We will attempt to reach you two times.  During this call, we will ask if you have developed any symptoms of COVID 19. If you develop any symptoms (ie: fever, flu-like symptoms, shortness of breath, cough etc.) before then, please call [phone].  If you test positive for Covid 19 in the 2 weeks post procedure, please call and report this information to [place].    If any biopsies were taken you will be contacted by phone or by letter within the next 1-3 weeks.  Please call us at [phone] if you have not heard about the biopsies in 3 weeks.    SIGNATURES/CONFIDENTIALITY: You and/or your care partner have signed paperwork which will be entered into your electronic medical record.  These signatures attest to the fact that that the information above on your After Visit Summary has been reviewed and is understood.  Full responsibility of the  confidentiality of this discharge information lies with you and/or your care-partner.  

## 2022-03-24 ENCOUNTER — Telehealth: Payer: Self-pay | Admitting: *Deleted

## 2022-03-24 ENCOUNTER — Encounter: Payer: Self-pay | Admitting: Gastroenterology

## 2022-03-24 NOTE — Telephone Encounter (Signed)
  Follow up Call-     03/23/2022    2:00 PM  Call back number  Post procedure Call Back phone  # [phone]  Permission to leave phone message Yes     Patient questions:  Do you have a fever, pain , or abdominal swelling? No. Pain Score  0 *  Have you tolerated food without any problems? Yes.    Have you been able to return to your normal activities? Yes.    Do you have any questions about your discharge instructions: Diet   No. Medications  No. Follow up visit  No.  Do you have questions or concerns about your Care? No.  Actions: * If pain score is 4 or above: No action needed, pain <4.

## 2022-04-04 ENCOUNTER — Other Ambulatory Visit: Payer: Self-pay | Admitting: Internal Medicine

## 2022-04-04 DIAGNOSIS — Z1231 Encounter for screening mammogram for malignant neoplasm of breast: Secondary | ICD-10-CM

## 2022-04-18 ENCOUNTER — Encounter: Payer: Self-pay | Admitting: Gastroenterology

## 2022-04-26 ENCOUNTER — Ambulatory Visit: Payer: Medicare Other | Admitting: Gastroenterology

## 2022-05-09 ENCOUNTER — Ambulatory Visit
Admission: RE | Admit: 2022-05-09 | Discharge: 2022-05-09 | Disposition: A | Discharge status: Home / Self Care | Payer: Medicare Other | Source: Ambulatory Visit | Attending: Internal Medicine | Admitting: Internal Medicine

## 2022-05-09 DIAGNOSIS — Z1231 Encounter for screening mammogram for malignant neoplasm of breast: Secondary | ICD-10-CM | POA: Diagnosis not present

## 2022-05-30 ENCOUNTER — Encounter: Payer: Self-pay | Admitting: Gastroenterology

## 2022-05-30 ENCOUNTER — Ambulatory Visit: Payer: Medicare Other | Admitting: Nurse Practitioner

## 2022-05-30 ENCOUNTER — Other Ambulatory Visit (INDEPENDENT_AMBULATORY_CARE_PROVIDER_SITE_OTHER): Payer: Medicare Other

## 2022-05-30 VITALS — BP 124/78 | HR 85 | Ht 69.0 in | Wt 142.0 lb

## 2022-05-30 DIAGNOSIS — A09 Infectious gastroenteritis and colitis, unspecified: Secondary | ICD-10-CM | POA: Diagnosis not present

## 2022-05-30 DIAGNOSIS — K52832 Lymphocytic colitis: Secondary | ICD-10-CM

## 2022-05-30 DIAGNOSIS — L821 Other seborrheic keratosis: Secondary | ICD-10-CM | POA: Diagnosis not present

## 2022-05-30 DIAGNOSIS — Z85828 Personal history of other malignant neoplasm of skin: Secondary | ICD-10-CM | POA: Diagnosis not present

## 2022-05-30 LAB — COMPREHENSIVE METABOLIC PANEL
ALT: 15 U/L (ref 0–35)
AST: 21 U/L (ref 0–37)
Albumin: 4.5 g/dL (ref 3.5–5.2)
Alkaline Phosphatase: 45 U/L (ref 39–117)
BUN: 14 mg/dL (ref 6–23)
CO2: 30 mEq/L (ref 19–32)
Calcium: 9.6 mg/dL (ref 8.4–10.5)
Chloride: 101 mEq/L (ref 96–112)
Creatinine, Ser: 0.68 mg/dL (ref 0.40–1.20)
GFR: 85.53 mL/min (ref >60.00)
Glucose, Bld: 95 mg/dL (ref 70–99)
Potassium: 4.4 mEq/L (ref 3.5–5.1)
Sodium: 139 mEq/L (ref 135–145)
Total Bilirubin: 0.4 mg/dL (ref 0.2–1.2)
Total Protein: 7 g/dL (ref 6.0–8.3)

## 2022-05-30 LAB — CBC WITH DIFFERENTIAL/PLATELET
Basophils Absolute: 0 10*3/uL (ref 0.0–0.1)
Basophils Relative: 0.7 % (ref 0.0–3.0)
Eosinophils Absolute: 0.1 10*3/uL (ref 0.0–0.7)
Eosinophils Relative: 1.7 % (ref 0.0–5.0)
HCT: 40.5 % (ref 36.0–46.0)
Hemoglobin: 13.3 g/dL (ref 12.0–15.0)
Lymphocytes Relative: 16.3 % (ref 12.0–46.0)
Lymphs Abs: 0.9 10*3/uL (ref 0.7–4.0)
MCHC: 32.8 g/dL (ref 30.0–36.0)
MCV: 96 fl (ref 78.0–100.0)
Monocytes Absolute: 0.5 10*3/uL (ref 0.1–1.0)
Monocytes Relative: 8.7 % (ref 3.0–12.0)
Neutro Abs: 4.2 10*3/uL (ref 1.4–7.7)
Neutrophils Relative %: 72.6 % (ref 43.0–77.0)
Platelets: 253 10*3/uL (ref 150.0–400.0)
RBC: 4.21 Mil/uL (ref 3.87–5.11)
RDW: 12.6 % (ref 11.5–15.5)
WBC: 5.8 10*3/uL (ref 4.0–10.5)

## 2022-05-30 NOTE — Patient Instructions (Addendum)
1)  IMODIUM 1 TO 2 TABS BY MOUTH TWICE DAILY AS NEEDED, STOP IF NO BOWEL MOVEMENT IN 24 HOURS  2) MAY START BUDESONIDE '3MG'$  TABS, TAKE 3 TABS ('9MG'$ ) BY MOUTH ONCE DAILY X 30 DAYS THEN TAKE 2 TABS ('6MG'$ ) ONCE DAILY X 30 DAYS THEN 1 TAB ('3MG'$  ) DAILY X 30 DAYS   3) REDUCE COFFEE INTAKE. NO DAIRY PRODUCTS FOR 1 TO 2 WEEKS   4) CONTACT DR. NANDIGAM IN ONE WEEK WITH AN UPDATE   _______________________________________________________  If you are age 29 or older, your body mass index should be between 23-30. Your Body mass index is 20.97 kg/m. If this is out of the aforementioned range listed, please consider follow up with your Primary Care Provider.  If you are age 65 or younger, your body mass index should be between 19-25. Your Body mass index is 20.97 kg/m. If this is out of the aformentioned range listed, please consider follow up with your Primary Care Provider.   ________________________________________________________  The [facility] providers would like to encourage you to use [facility] to communicate with providers for non-urgent requests or questions.  Due to long hold times on the telephone, sending your provider a message by [facility] may be a faster and more efficient way to get a response.  Please allow 48 business hours for a response.  Please remember that this is for non-urgent requests.  _______________________________________________________

## 2022-05-30 NOTE — Progress Notes (Signed)
05/30/2022 Vanessa Larsen [id number] [birth year]   Chief Complaint: Diarrhea   History of Present Illness: Vanessa Larsen is a 76 year old female with a history of lymphocytic colitis. She is followed by Dr. Silverio Decamp. She presents today for further evaluation regarding recurrent diarrhea. She reported being on and off Budesonide for lymphocytic colitis for years. She last took Budesonide '9mg'$  po QD 2/29/2023 which she tapered off by 04/21/2022.  Her most recent colonoscopy was 03/23/2022 while on Budesonide which identified two 4-6 mm tubular adenomatous/sessile serrated polyps which were removed from the transverse colon and random colon biopsies were negative, no of active lymphocytic colitis or IBD.  She was passing a fairly normal formed bowel movement until 05/18/2022 when she awakened in the morning and had 3 nonbloody watery bowel movements from 530 to 7:30 AM.  She had 3 similar episodes of diarrhea on 05/19/2022.  She awakened during the middle the night on 8/18 to pass several watery diarrhea bowel movements.  Last week, she felt as if she had completed a colonoscopy prep with excessive diarrhea for which she took 2 Imodium tablets daily x 2 days and her diarrhea improved.  She passed a solid stool on 05/27/2022 and is passed several loose stools daily since then.  He was prescribed Terbinafine for toenail fungal infection 11/2021.  No recent antibiotics.  She stated her current diarrhea is similar to the diarrhea she experienced when she was treated with Budesonide for lymphocytic colitis in the past.  She has been eating more fresh tomatoes and she questions if this possibly triggered her diarrhea.  She uses milk with cereal and in her coffee daily.  She previously went on a lactose-free diet in the past which did not improve her diarrhea symptoms.  Pepto-Bismol was ineffective when used in the past.  No abdominal pain.  No NSAIDs.  She is a traveling for the next few days and will return  home early next week.   Colonoscopy 03/23/2022: Normal mucosa in the entire examined colon. Biopsied. - Two 4 to 6 mm polyps in the transverse colon, removed with a cold snare. Resected and retrieved. - Non-bleeding external and internal hemorrhoids. 1. Surgical [P], colon nos, random sites - COLONIC MUCOSA WITH EDEMA, OTHERWISE UNREMARKABLE - NEGATIVE FOR ACUTE INFLAMMATION, INCREASED INTRAEPITHELIAL LYMPHOCYTES OR THICKENED SUBEPITHELIAL COLLAGEN TABLE 2. Surgical [P], colon, transverse, polyp (2) - TUBULAR ADENOMA WITHOUT HIGH-GRADE DYSPLASIA OR MALIGNANCY - SESSILE SERRATED POLYP WITHOUT CYTOLOGIC DYSPLASIA      Latest Ref Rng & Units 02/02/2022    9:19 AM 12/15/2020   12:01 PM 04/18/2018    5:43 AM  CBC  WBC 4.0 - 10.5 K/uL 4.6  5.4  8.5   Hemoglobin 12.0 - 15.0 g/dL 13.6  13.5  10.6   Hematocrit 36.0 - 46.0 % 40.6  39.6  32.0   Platelets 150.0 - 400.0 K/uL 243.0  231.0  229        Latest Ref Rng & Units 02/02/2022    9:19 AM 12/15/2020   12:01 PM 04/18/2018    5:43 AM  CMP  Glucose 70 - 99 mg/dL 72  100  98   BUN 6 - 23 mg/dL '18  18  15   '$ Creatinine 0.40 - 1.20 mg/dL 0.73  0.68  0.57   Sodium 135 - 145 mEq/L 137  139  143   Potassium 3.5 - 5.1 mEq/L 3.9  3.5  3.5   Chloride 96 - 112 mEq/L 100  102  105   CO2 19 - 32 mEq/L '30  30  31   '$ Calcium 8.4 - 10.5 mg/dL 9.7  9.5  8.6   Total Protein 6.0 - 8.3 g/dL 7.2  7.1    Total Bilirubin 0.2 - 1.2 mg/dL 0.5  0.5    Alkaline Phos 39 - 117 U/L 49  44    AST 0 - 37 U/L 19  19    ALT 0 - 35 U/L 16  15      Current Outpatient Medications on File Prior to Visit  Medication Sig Dispense Refill   atorvastatin (LIPITOR) 20 MG tablet Take 20 mg by mouth every evening.      budesonide (ENTOCORT EC) 3 MG 24 hr capsule Take 3 capsules (9 mg total) by mouth daily. 90 capsule 2   levothyroxine (SYNTHROID, LEVOTHROID) 100 MCG tablet Take 100 mcg by mouth daily before breakfast.     ondansetron (ZOFRAN) 4 MG tablet Take 1 tablet po q 6  hours prn     No current facility-administered medications on file prior to visit.   Allergies  Allergen Reactions   Fentanyl Nausea And Vomiting   Nicotine Polacrilex Other (See Comments)   Oxycodone     Hypotension    Current Medications, Allergies, Past Medical History, Past Surgical History, Family History and Social History were reviewed in [employer] record.   Review of Systems:   Constitutional: Negative for fever, sweats, chills or weight loss.  Respiratory: Negative for shortness of breath.   Cardiovascular: Negative for chest pain, palpitations and leg swelling.  Gastrointestinal: See HPI.  Musculoskeletal: Negative for back pain or muscle aches.  Neurological: Negative for dizziness, headaches or paresthesias.    Physical Exam: BP 124/78   Pulse 85   Ht '5\' 9"'$  (1.753 m)   Wt 142 lb (64.4 kg)   BMI 20.97 kg/m  Wt Readings from Last 3 Encounters:  05/30/22 142 lb (64.4 kg)  03/23/22 139 lb (63 kg)  02/02/22 139 lb (63 kg)    General: Pleasant 76 year old female in no acute distress. Head: Normocephalic and atraumatic. Eyes: No scleral icterus. Conjunctiva pink . Ears: Normal auditory acuity. Mouth: Dentition intact. No ulcers or lesions.  Lungs: Clear throughout to auscultation. Heart: Regular rate and rhythm, no murmur. Abdomen: Soft, nontender and nondistended. No masses or hepatomegaly. Normal bowel sounds x 4 quadrants.  Rectal: Deferred.  Musculoskeletal: Symmetrical with no gross deformities. Extremities: No edema. Neurological: Alert oriented x 4. No focal deficits.  Psychological: Alert and cooperative. Normal mood and affect  Assessment and Recommendations:  9) 76 year old female with lymphocytic colitis with recurrent diarrhea.  Colonoscopy 03/2022 without evidence of active lymphocytic colitis, no IBD. -GI pathogen panel to exclude any infectious diarrhea -Imodium 2 tabs p.o. twice daily -Budesonide 9 mg Q day x 1 month  then '6mg'$  QD x 1 month then '3mg'$  QD x 1 month  -Avoid dairy products -Reduce coffee intake -Push fluids -Patient to contact office if symptoms worsen  2) Two tubular adenomatous/sessile serrated polyps removed from the colon per colonoscopy 03/2022 -Next colonoscopy due 03/2027

## 2022-06-07 ENCOUNTER — Other Ambulatory Visit: Payer: Medicare Other

## 2022-06-07 DIAGNOSIS — K52832 Lymphocytic colitis: Secondary | ICD-10-CM

## 2022-06-07 DIAGNOSIS — A09 Infectious gastroenteritis and colitis, unspecified: Secondary | ICD-10-CM

## 2022-06-09 LAB — GI PROFILE, STOOL, PCR

## 2022-06-14 ENCOUNTER — Telehealth: Payer: Self-pay | Admitting: Nurse Practitioner

## 2022-06-14 NOTE — Telephone Encounter (Signed)
Patient is returning call regarding lab results

## 2022-06-14 NOTE — Telephone Encounter (Signed)
Spoke with pt. Documented under lab results:  Pt verbalized understanding with all questions answered.

## 2022-07-13 DIAGNOSIS — E039 Hypothyroidism, unspecified: Secondary | ICD-10-CM | POA: Diagnosis not present

## 2022-07-13 DIAGNOSIS — R82998 Other abnormal findings in urine: Secondary | ICD-10-CM | POA: Diagnosis not present

## 2022-07-13 DIAGNOSIS — E785 Hyperlipidemia, unspecified: Secondary | ICD-10-CM | POA: Diagnosis not present

## 2022-07-13 DIAGNOSIS — R7301 Impaired fasting glucose: Secondary | ICD-10-CM | POA: Diagnosis not present

## 2022-07-13 DIAGNOSIS — I10 Essential (primary) hypertension: Secondary | ICD-10-CM | POA: Diagnosis not present

## 2022-07-26 DIAGNOSIS — I10 Essential (primary) hypertension: Secondary | ICD-10-CM | POA: Diagnosis not present

## 2022-07-26 DIAGNOSIS — G6289 Other specified polyneuropathies: Secondary | ICD-10-CM | POA: Diagnosis not present

## 2022-07-26 DIAGNOSIS — E785 Hyperlipidemia, unspecified: Secondary | ICD-10-CM | POA: Diagnosis not present

## 2022-07-26 DIAGNOSIS — R7301 Impaired fasting glucose: Secondary | ICD-10-CM | POA: Diagnosis not present

## 2022-07-26 DIAGNOSIS — Z0000 Encounter for general adult medical examination without abnormal findings: Secondary | ICD-10-CM | POA: Diagnosis not present

## 2022-07-26 DIAGNOSIS — Z1339 Encounter for screening examination for other mental health and behavioral disorders: Secondary | ICD-10-CM | POA: Diagnosis not present

## 2022-07-26 DIAGNOSIS — Z1331 Encounter for screening for depression: Secondary | ICD-10-CM | POA: Diagnosis not present

## 2022-09-09 DIAGNOSIS — H52203 Unspecified astigmatism, bilateral: Secondary | ICD-10-CM | POA: Diagnosis not present

## 2022-10-19 ENCOUNTER — Encounter: Payer: Self-pay | Admitting: Gastroenterology

## 2022-10-19 ENCOUNTER — Ambulatory Visit: Payer: Medicare Other | Admitting: Gastroenterology

## 2022-10-19 VITALS — BP 144/82 | HR 90 | Ht 69.0 in | Wt 142.0 lb

## 2022-10-19 DIAGNOSIS — R197 Diarrhea, unspecified: Secondary | ICD-10-CM | POA: Diagnosis not present

## 2022-10-19 DIAGNOSIS — K52832 Lymphocytic colitis: Secondary | ICD-10-CM | POA: Diagnosis not present

## 2022-10-19 MED ORDER — BUDESONIDE 3 MG PO CPEP: 3.0000 mg | ORAL_CAPSULE | Freq: Every day | ORAL | 0 refills | Status: DC

## 2022-10-19 NOTE — Progress Notes (Signed)
Vanessa Larsen    [id number]    1945/12/01  Primary Care Physician:Shaw, Emily Filbert., MD  Referring Physician: Ginger Organ., MD [address] [place],   [address]   Chief complaint: Lymphocytic colitis  HPI:  77 yr very pleasant female with history of lymphocytic colitis here for follow-up visit.  She has been on prolonged budesonide course since May 2023, currently at 3 mg daily   GI pathogen panel negative for any acute GI infection Negative celiac Negative for pancreatic insufficiency Colonoscopy with biopsies in June 2023, while on budesonide biopsies negative for ulcerative colitis or microscopic colitis She is currently having formed stool 1-2 bowel movements per day, most days it is soft semiformed.  No blood in stool or black stool.  GI history She developed recurrent diarrhea 6 to 8 weeks ago after she was started on terbinafine.  She was having multiple bowel movements, approximately 10 to 15/day and was also waking up 3-4 times in the night with fecal urgency.   Colonoscopy March 23, 2022 - Normal mucosa in the entire examined colon. Biopsied. - Two 4 to 6 mm polyps in the transverse colon, removed with a cold snare. Resected and retrieved. - Non-bleeding external and internal hemorrhoids. 1. Surgical [P], colon nos, random sites - COLONIC MUCOSA WITH EDEMA, OTHERWISE UNREMARKABLE - NEGATIVE FOR ACUTE INFLAMMATION, INCREASED INTRAEPITHELIAL LYMPHOCYTES OR THICKENED SUBEPITHELIAL COLLAGEN TABLE 2. Surgical [P], colon, transverse, polyp (2) - TUBULAR ADENOMA WITHOUT HIGH-GRADE DYSPLASIA OR MALIGNANCY - SESSILE SERRATED POLYP WITHOUT CYTOLOGIC DYSPLASIA  Outpatient Encounter Medications as of 10/19/2022  Medication Sig   atorvastatin (LIPITOR) 20 MG tablet Take 20 mg by mouth every evening.    budesonide (ENTOCORT EC) 3 MG 24 hr capsule Take 3 capsules (9 mg total) by mouth daily. (Patient taking differently: Take 3 mg by mouth  daily.)   levothyroxine (SYNTHROID, LEVOTHROID) 100 MCG tablet Take 100 mcg by mouth daily before breakfast.   ondansetron (ZOFRAN) 4 MG tablet Take 1 tablet po q 6 hours prn   No facility-administered encounter medications on file as of 10/19/2022.    Allergies as of 10/19/2022 - Review Complete 10/19/2022  Allergen Reaction Noted   Fentanyl Nausea And Vomiting 06/30/2016   Nicotine polacrilex Other (See Comments) 07/09/2021   Oxycodone  08/21/2018    Past Medical History:  Diagnosis Date   Anal fissure    Arthritis    Elevated cholesterol    Hyperplastic colon polyp    Hypothyroidism    IBS (irritable bowel syndrome)    Internal hemorrhoids    Small   Microscopic colitis    Osteoarthritis    Ovarian cyst, left    PONV (postoperative nausea and vomiting)    Vasovagal syncope    very easily, on airplanes (trans atlanic), after sleeping    Past Surgical History:  Procedure Laterality Date   BREAST BIOPSY Left    neg   CATARACT EXTRACTION, BILATERAL     COLONOSCOPY     COLPOSCOPY     EYE SURGERY     cosmetic   GYNECOLOGIC CRYOSURGERY     age 77   TOTAL KNEE ARTHROPLASTY Right 04/16/2018   Procedure: RIGHT TOTAL KNEE ARTHROPLASTY;  Surgeon: Gaynelle Arabian, MD;  Location: [facility];  Service: Orthopedics;  Laterality: Right;   UPPER GI ENDOSCOPY      Family History  Problem Relation Age of Onset   Esophageal cancer Mother    Pancreatic  cancer Father    Heart disease Father    Peptic Ulcer Sister    Gallstones Sister    Cancer Maternal Aunt        uterine   Heart disease Paternal Grandmother    Diabetes Cousin    Colon cancer Neg Hx    Breast cancer Neg Hx     Social History   Socioeconomic History   Marital status: Married    Spouse name: Not on file   Number of children: 3   Years of education: Not on file   Highest education level: Not on file  Occupational History   Occupation: homemaker  Tobacco Use   Smoking status: Former    Types: Cigarettes     Quit date: 10/04/1971    Years since quitting: 51.0   Smokeless tobacco: Never  Vaping Use   Vaping Use: Never used  Substance and Sexual Activity   Alcohol use: Yes    Alcohol/week: 14.0 standard drinks of alcohol    Types: 14 Standard drinks or equivalent per week    Comment: 2 drinks per day   Drug use: No   Sexual activity: Not Currently    Birth control/protection: Post-menopausal    Comment: 1st intercourse 77 yo-Fewer than 5 partners  Other Topics Concern   Not on file  Social History Narrative   Not on file   Social Determinants of Health   Financial Resource Strain: Not on file  Food Insecurity: Not on file  Transportation Needs: Not on file  Physical Activity: Not on file  Stress: Not on file  Social Connections: Not on file  Intimate Partner Violence: Not on file      Review of systems: All other review of systems negative except as mentioned in the HPI.   Physical Exam: Vitals:   10/19/22 0852  BP: (Abnormal) 144/82  Pulse: 90  SpO2: 98%   Body mass index is 20.97 kg/m. Gen:      No acute distress HEENT:  sclera anicteric Neuro: alert and oriented x 3 Psych: normal mood and affect  Data Reviewed:  Reviewed labs, radiology imaging, old records and pertinent past GI work up   Assessment and Plan/Recommendations:  77 year old very pleasant female pleasant female with history of lymphocytic colitis, recurrent diarrhea, improved with budesonide   Is currently asymptomatic on low-dose budesonide 3 mg daily Add Benefiber 1 tablespoon twice daily with meals  Reluctant to taper off budesonide given severe symptoms she developed last year, will plan to taper off budesonide once she is back from her vacation in [place] in February  If she develops recurrent diarrhea, restart budesonide at 6 mg daily and then will plan to overlap with sulfasalazine 1 g twice daily along with folic acid 1 mg daily and taper down budesonide in 2 to 4 weeks If she has recurrent diarrhea  off budesonide,  likely need sulfasalazine to maintain long-term remission Alternate option will be to maintain on low-dose budesonide 3 mg daily     Return in March for follow-up   The patient was provided an opportunity to ask questions and all were answered. The patient agreed with the plan and demonstrated an understanding of the instructions.  Damaris Hippo , MD    CC: Ginger Organ., MD

## 2022-10-19 NOTE — Patient Instructions (Signed)
We have sent the following medications to your pharmacy for you to pick up at your convenience:  Budesonide    Take Benefiber 1 tablespoon twice a day with meals  _______________________________________________________  If your blood pressure at your visit was 140/90 or greater, please contact your primary care physician to follow up on this.  _______________________________________________________  If you are age 76 or older, your body mass index should be between 23-30. Your Body mass index is 20.97 kg/m. If this is out of the aforementioned range listed, please consider follow up with your Primary Care Provider.  If you are age 31 or younger, your body mass index should be between 19-25. Your Body mass index is 20.97 kg/m. If this is out of the aformentioned range listed, please consider follow up with your Primary Care Provider.   ________________________________________________________  The [facility] providers would like to encourage you to use [facility] to communicate with providers for non-urgent requests or questions.  Due to long hold times on the telephone, sending your provider a message by [facility] may be a faster and more efficient way to get a response.  Please allow 48 business hours for a response.  Please remember that this is for non-urgent requests.  _______________________________________________________ Meda Coffee appreciate the  opportunity to care for you  Thank You   Harl Bowie , MD

## 2022-10-24 DIAGNOSIS — L821 Other seborrheic keratosis: Secondary | ICD-10-CM | POA: Diagnosis not present

## 2022-10-24 DIAGNOSIS — L720 Epidermal cyst: Secondary | ICD-10-CM | POA: Diagnosis not present

## 2022-10-24 DIAGNOSIS — D1801 Hemangioma of skin and subcutaneous tissue: Secondary | ICD-10-CM | POA: Diagnosis not present

## 2022-10-24 DIAGNOSIS — L820 Inflamed seborrheic keratosis: Secondary | ICD-10-CM | POA: Diagnosis not present

## 2022-10-24 DIAGNOSIS — L570 Actinic keratosis: Secondary | ICD-10-CM | POA: Diagnosis not present

## 2022-10-24 DIAGNOSIS — Z85828 Personal history of other malignant neoplasm of skin: Secondary | ICD-10-CM | POA: Diagnosis not present

## 2022-11-29 DIAGNOSIS — K080 Exfoliation of teeth due to systemic causes: Secondary | ICD-10-CM | POA: Diagnosis not present

## 2022-12-16 ENCOUNTER — Ambulatory Visit: Payer: Medicare Other | Admitting: Gastroenterology

## 2022-12-27 ENCOUNTER — Ambulatory Visit: Payer: Medicare Other | Admitting: Gastroenterology

## 2022-12-27 DIAGNOSIS — K52832 Lymphocytic colitis: Secondary | ICD-10-CM

## 2023-04-26 ENCOUNTER — Other Ambulatory Visit: Payer: Self-pay | Admitting: Internal Medicine

## 2023-04-26 DIAGNOSIS — Z1231 Encounter for screening mammogram for malignant neoplasm of breast: Secondary | ICD-10-CM

## 2023-05-03 DIAGNOSIS — M79671 Pain in right foot: Secondary | ICD-10-CM | POA: Diagnosis not present

## 2023-05-03 DIAGNOSIS — R202 Paresthesia of skin: Secondary | ICD-10-CM | POA: Diagnosis not present

## 2023-05-11 ENCOUNTER — Ambulatory Visit
Admission: RE | Admit: 2023-05-11 | Discharge: 2023-05-11 | Disposition: A | Discharge status: Home / Self Care | Payer: Medicare Other | Source: Ambulatory Visit | Attending: Internal Medicine | Admitting: Internal Medicine

## 2023-05-11 DIAGNOSIS — Z1231 Encounter for screening mammogram for malignant neoplasm of breast: Secondary | ICD-10-CM

## 2023-06-12 DIAGNOSIS — R202 Paresthesia of skin: Secondary | ICD-10-CM | POA: Diagnosis not present

## 2023-06-12 DIAGNOSIS — M545 Low back pain, unspecified: Secondary | ICD-10-CM | POA: Diagnosis not present

## 2023-06-21 DIAGNOSIS — K080 Exfoliation of teeth due to systemic causes: Secondary | ICD-10-CM | POA: Diagnosis not present

## 2023-07-10 DIAGNOSIS — M5459 Other low back pain: Secondary | ICD-10-CM | POA: Diagnosis not present

## 2023-07-10 DIAGNOSIS — M51369 Other intervertebral disc degeneration, lumbar region without mention of lumbar back pain or lower extremity pain: Secondary | ICD-10-CM | POA: Diagnosis not present

## 2023-07-10 DIAGNOSIS — M5451 Vertebrogenic low back pain: Secondary | ICD-10-CM | POA: Diagnosis not present

## 2023-08-07 DIAGNOSIS — E039 Hypothyroidism, unspecified: Secondary | ICD-10-CM | POA: Diagnosis not present

## 2023-08-07 DIAGNOSIS — M5459 Other low back pain: Secondary | ICD-10-CM | POA: Diagnosis not present

## 2023-08-07 DIAGNOSIS — E785 Hyperlipidemia, unspecified: Secondary | ICD-10-CM | POA: Diagnosis not present

## 2023-08-07 DIAGNOSIS — I10 Essential (primary) hypertension: Secondary | ICD-10-CM | POA: Diagnosis not present

## 2023-08-07 DIAGNOSIS — Z0000 Encounter for general adult medical examination without abnormal findings: Secondary | ICD-10-CM | POA: Diagnosis not present

## 2023-08-07 DIAGNOSIS — G629 Polyneuropathy, unspecified: Secondary | ICD-10-CM | POA: Diagnosis not present

## 2023-08-07 DIAGNOSIS — R7301 Impaired fasting glucose: Secondary | ICD-10-CM | POA: Diagnosis not present

## 2023-08-14 DIAGNOSIS — M51369 Other intervertebral disc degeneration, lumbar region without mention of lumbar back pain or lower extremity pain: Secondary | ICD-10-CM | POA: Diagnosis not present

## 2023-08-14 DIAGNOSIS — M5459 Other low back pain: Secondary | ICD-10-CM | POA: Diagnosis not present

## 2023-08-14 DIAGNOSIS — M5451 Vertebrogenic low back pain: Secondary | ICD-10-CM | POA: Diagnosis not present

## 2023-08-15 DIAGNOSIS — Z0000 Encounter for general adult medical examination without abnormal findings: Secondary | ICD-10-CM | POA: Diagnosis not present

## 2023-08-15 DIAGNOSIS — Z1331 Encounter for screening for depression: Secondary | ICD-10-CM | POA: Diagnosis not present

## 2023-08-15 DIAGNOSIS — Z1339 Encounter for screening examination for other mental health and behavioral disorders: Secondary | ICD-10-CM | POA: Diagnosis not present

## 2023-08-15 DIAGNOSIS — Z23 Encounter for immunization: Secondary | ICD-10-CM | POA: Diagnosis not present

## 2023-08-15 DIAGNOSIS — I10 Essential (primary) hypertension: Secondary | ICD-10-CM | POA: Diagnosis not present

## 2023-09-13 DIAGNOSIS — G5761 Lesion of plantar nerve, right lower limb: Secondary | ICD-10-CM | POA: Diagnosis not present

## 2023-10-10 DIAGNOSIS — H52203 Unspecified astigmatism, bilateral: Secondary | ICD-10-CM | POA: Diagnosis not present

## 2023-10-30 DIAGNOSIS — L720 Epidermal cyst: Secondary | ICD-10-CM | POA: Diagnosis not present

## 2023-10-30 DIAGNOSIS — L821 Other seborrheic keratosis: Secondary | ICD-10-CM | POA: Diagnosis not present

## 2023-10-30 DIAGNOSIS — Z85828 Personal history of other malignant neoplasm of skin: Secondary | ICD-10-CM | POA: Diagnosis not present

## 2023-10-30 DIAGNOSIS — L738 Other specified follicular disorders: Secondary | ICD-10-CM | POA: Diagnosis not present

## 2023-10-30 DIAGNOSIS — L570 Actinic keratosis: Secondary | ICD-10-CM | POA: Diagnosis not present

## 2023-12-14 ENCOUNTER — Ambulatory Visit (INDEPENDENT_AMBULATORY_CARE_PROVIDER_SITE_OTHER): Payer: Medicare Other | Admitting: Obstetrics and Gynecology

## 2023-12-14 ENCOUNTER — Other Ambulatory Visit (HOSPITAL_COMMUNITY)
Admission: RE | Admit: 2023-12-14 | Discharge: 2023-12-14 | Disposition: A | Discharge status: Home / Self Care | Source: Ambulatory Visit | Attending: Obstetrics and Gynecology | Admitting: Obstetrics and Gynecology

## 2023-12-14 ENCOUNTER — Encounter: Payer: Self-pay | Admitting: Obstetrics and Gynecology

## 2023-12-14 VITALS — BP 118/84 | HR 84 | Ht 68.25 in | Wt 141.0 lb

## 2023-12-14 DIAGNOSIS — Z780 Asymptomatic menopausal state: Secondary | ICD-10-CM | POA: Diagnosis not present

## 2023-12-14 DIAGNOSIS — K52839 Microscopic colitis, unspecified: Secondary | ICD-10-CM

## 2023-12-14 DIAGNOSIS — Z124 Encounter for screening for malignant neoplasm of cervix: Secondary | ICD-10-CM

## 2023-12-14 DIAGNOSIS — Z01419 Encounter for gynecological examination (general) (routine) without abnormal findings: Secondary | ICD-10-CM

## 2023-12-14 NOTE — Patient Instructions (Signed)
 Provided number for scheduling BMD at [facility], [phone].     EXERCISE AND DIET:  We recommended that you start or continue a regular exercise program for good health. Regular exercise means any activity that makes your heart beat faster and makes you sweat.  We recommend exercising at least 30 minutes per day at least 3 days a week, preferably 4 or 5.  We also recommend a diet low in fat and sugar.  Inactivity, poor dietary choices and obesity can cause diabetes, heart attack, stroke, and kidney damage, among others.    ALCOHOL AND SMOKING:  Women should limit their alcohol intake to no more than 7 drinks/beers/glasses of wine (combined, not each!) per week. Moderation of alcohol intake to this level decreases your risk of breast cancer and liver damage. And of course, no recreational drugs are part of a healthy lifestyle.  And absolutely no smoking or even second hand smoke. Most people know smoking can cause heart and lung diseases, but did you know it also contributes to weakening of your bones? Aging of your skin?  Yellowing of your teeth and nails?  CALCIUM AND VITAMIN D:  Adequate intake of calcium and Vitamin D are recommended.  The recommendations for exact amounts of these supplements seem to change often, but generally speaking 600 mg of calcium (either carbonate or citrate) and 800 units of Vitamin D per day seems prudent. Certain women may benefit from higher intake of Vitamin D.  If you are among these women, your doctor will have told you during your visit.    PAP SMEARS:  Pap smears, to check for cervical cancer or precancers,  have traditionally been done yearly, although recent scientific advances have shown that most women can have pap smears less often.  However, every woman still should have a physical exam from her gynecologist every year. It will include a breast check, inspection of the vulva and vagina to check for abnormal growths or skin changes, a visual exam of the cervix,  and then an exam to evaluate the size and shape of the uterus and ovaries.  And after 77 years of age, a rectal exam is indicated to check for rectal cancers. We will also provide age appropriate advice regarding health maintenance, like when you should have certain vaccines, screening for sexually transmitted diseases, bone density testing, colonoscopy, mammograms, etc.   MAMMOGRAMS:  All women over 59 years old should have a yearly mammogram. Many facilities now offer a "3D" mammogram, which may cost around $50 extra out of pocket. If possible,  we recommend you accept the option to have the 3D mammogram performed.  It both reduces the number of women who will be called back for extra views which then turn out to be normal, and it is better than the routine mammogram at detecting truly abnormal areas.    COLONOSCOPY:  Colonoscopy to screen for colon cancer is recommended for all women at age 28.  We know, you hate the idea of the prep.  We agree, BUT, having colon cancer and not knowing it is worse!!  Colon cancer so often starts as a polyp that can be seen and removed at colonscopy, which can quite literally save your life!  And if your first colonoscopy is normal and you have no family history of colon cancer, most women don't have to have it again for 10 years.  Once every ten years, you can do something that may end up saving your life, right?  We will  be happy to help you get it scheduled when you are ready.  Be sure to check your insurance coverage so you understand how much it will cost.  It may be covered as a preventative service at no cost, but you should check your particular policy.

## 2023-12-14 NOTE — Progress Notes (Unsigned)
 78 y.o. G31P3003 Married Caucasian female here for a breast and pelvic exam. Desires to know how much/often she should still come to see [place] and if/when can get another bone scan.  PHQ2-0  The patient is also followed for ***.  Has microscopic colitis and has some flares.  She treats with Budesonide.   She has some persistent diarrhea.  Sees [facility].   PCP: Cleatis Polka., MD   No LMP recorded. Patient is postmenopausal.           Sexually active: No.  The current method of family planning is post menopausal status.    Menopausal hormone therapy: n/a Exercising: Yes.     2-3 days per wk, walking/golf Smoker: no, former.  OB History     Gravida  3   Para  3   Term  3   Preterm      AB      Living  3      SAB      IAB      Ectopic      Multiple      Live Births              HEALTH MAINTENANCE: Last 2 paps: 2018-WNL, 10/28/2021-WNL History of abnormal Pap or positive HPV: yes, Hx of cryotherapy 25 - 30 years ago and normal paps since.  Mammogram: 05/11/2023-neg birads 1, Cat C Colonoscopy: microscopic colits; 03/2022, see GI Bone Density: 08/20/2019  Result WNL    Immunization History  Administered Date(s) Administered   DTaP, 5 pertussis antigens 03/21/2018   Influenza Split 01/25/2010, 07/21/2010, 09/01/2011, 06/05/2013   Influenza, Quadrivalent, Recombinant, Inj, Pf 06/30/2018, 07/04/2019, 07/22/2020, 07/09/2021, 08/15/2023   Influenza,inj,Quad PF,6+ Mos 06/18/2014, 06/25/2015, 06/30/2016, 07/24/2017   Influenza-Unspecified 08/16/2017   PFIZER(Purple Top)SARS-COV-2 Vaccination 10/24/2019, 11/14/2019, 06/25/2020   Pneumococcal Conjugate-13 01/29/2014   Pneumococcal Polysaccharide-23 12/22/2010, 01/24/2013, 07/03/2020, 08/15/2023   Tdap 04/16/2008   Zoster Recombinant(Shingrix) 11/29/2020, 01/22/2021   Zoster, Live 12/22/2010      reports that she quit smoking about 52 years ago. Her smoking use included cigarettes. She has never used  smokeless tobacco. She reports current alcohol use of about 14.0 standard drinks of alcohol per week. She reports that she does not use drugs.  Past Medical History:  Diagnosis Date   Anal fissure    Arthritis    Elevated cholesterol    Hyperplastic colon polyp    Hypothyroidism    IBS (irritable bowel syndrome)    Internal hemorrhoids    Small   Microscopic colitis    Osteoarthritis    Ovarian cyst, left    PONV (postoperative nausea and vomiting)    Vasovagal syncope    very easily, on airplanes (trans atlanic), after sleeping    Past Surgical History:  Procedure Laterality Date   BREAST BIOPSY Left    neg   CATARACT EXTRACTION, BILATERAL     COLONOSCOPY     COLPOSCOPY     EYE SURGERY     cosmetic   GYNECOLOGIC CRYOSURGERY     age 5   TOTAL KNEE ARTHROPLASTY Right 04/16/2018   Procedure: RIGHT TOTAL KNEE ARTHROPLASTY;  Surgeon: Ollen Gross, MD;  Location: [facility];  Service: Orthopedics;  Laterality: Right;   UPPER GI ENDOSCOPY      Current Outpatient Medications  Medication Sig Dispense Refill   atorvastatin (LIPITOR) 20 MG tablet Take 20 mg by mouth every evening.      betamethasone dipropionate 0.05 % cream Apply topically  2 (two) times daily as needed.     budesonide (ENTOCORT EC) 3 MG 24 hr capsule Take 1 capsule (3 mg total) by mouth daily. 90 capsule 0   clobetasol (TEMOVATE) 0.05 % external solution APPLY TO SCALP DAILY AS NEEDED FOR ITCHING     hydrocortisone 2.5 % cream      levothyroxine (SYNTHROID, LEVOTHROID) 100 MCG tablet Take 100 mcg by mouth daily before breakfast.     ondansetron (ZOFRAN) 4 MG tablet Take 1 tablet po q 6 hours prn     zolpidem (AMBIEN) 5 MG tablet Take by mouth.     No current facility-administered medications for this visit.    ALLERGIES: Fentanyl, Nicotine, Nicotine polacrilex, and Oxycodone  Family History  Problem Relation Age of Onset   Esophageal cancer Mother    Pancreatic cancer Father    Heart disease Father     Peptic Ulcer Sister    Gallstones Sister    Cancer Maternal Aunt        uterine   Heart disease Paternal Grandmother    Diabetes Cousin    Colon cancer Neg Hx    Breast cancer Neg Hx     Review of Systems  All other systems reviewed and are negative.   PHYSICAL EXAM:  BP 118/84   Pulse 84   Ht 5' 8.25" (1.734 m)   Wt 141 lb (64 kg)   SpO2 99%   BMI 21.28 kg/m     General appearance: alert, cooperative and appears stated age Head: normocephalic, without obvious abnormality, atraumatic Neck: no adenopathy, supple, symmetrical, trachea midline and thyroid normal to inspection and palpation Lungs: clear to auscultation bilaterally Breasts: normal appearance, no masses or tenderness, No nipple retraction or dimpling, No nipple discharge or bleeding, No axillary adenopathy Heart: regular rate and rhythm Abdomen: soft, non-tender; no masses, no organomegaly Extremities: extremities normal, atraumatic, no cyanosis or edema Skin: skin color, texture, turgor normal. No rashes or lesions Lymph nodes: cervical, supraclavicular, and axillary nodes normal. Neurologic: grossly normal  Pelvic: External genitalia:  no lesions              No abnormal inguinal nodes palpated.              Urethra:  normal appearing urethra with no masses, tenderness or lesions              Bartholins and Skenes: normal                 Vagina: normal appearing vagina with normal color and discharge, no lesions              Cervix: no lesions              Pap taken: {yes no:314532} Bimanual Exam:  Uterus:  normal size, contour, position, consistency, mobility, non-tender              Adnexa: no mass, fullness, tenderness              Rectal exam: {yes no:314532}.  Confirms.              Anus:  normal sphincter tone, no lesions  Chaperone was present for exam:  {BSCHAPERONE:31226::"Emily F, CMA"}  ASSESSMENT: Encounter for breast and pelvic exam.  Remote history of cryotherapy.  FH osteoporosis.   Microscopic colitis.  ***  PLAN: Mammogram screening discussed. Self breast awareness reviewed. Pap and HRV collected:  {yes no:314532} Guidelines for Calcium, Vitamin D, regular exercise program including cardiovascular  and weight bearing exercise. Medication refills:  *** {LABS (Optional):23779} Follow up:  ***    Additional counseling given.  {yes [id number]. ***  total time was spent for this patient encounter, including preparation, face-to-face counseling with the patient, coordination of care, and documentation of the encounter in addition to doing the breast and pelvic exam.

## 2023-12-18 ENCOUNTER — Encounter: Payer: Self-pay | Admitting: Obstetrics and Gynecology

## 2023-12-18 LAB — CYTOLOGY - PAP: Diagnosis: NEGATIVE

## 2023-12-21 ENCOUNTER — Ambulatory Visit (HOSPITAL_BASED_OUTPATIENT_CLINIC_OR_DEPARTMENT_OTHER)
Admission: RE | Admit: 2023-12-21 | Discharge: 2023-12-21 | Disposition: A | Discharge status: Home / Self Care | Source: Ambulatory Visit | Attending: Obstetrics and Gynecology | Admitting: Obstetrics and Gynecology

## 2023-12-21 ENCOUNTER — Encounter: Payer: Self-pay | Admitting: Obstetrics and Gynecology

## 2023-12-21 DIAGNOSIS — K52839 Microscopic colitis, unspecified: Secondary | ICD-10-CM | POA: Insufficient documentation

## 2023-12-21 DIAGNOSIS — Z780 Asymptomatic menopausal state: Secondary | ICD-10-CM | POA: Insufficient documentation

## 2023-12-21 DIAGNOSIS — M85852 Other specified disorders of bone density and structure, left thigh: Secondary | ICD-10-CM | POA: Diagnosis not present

## 2023-12-21 DIAGNOSIS — M85851 Other specified disorders of bone density and structure, right thigh: Secondary | ICD-10-CM | POA: Diagnosis not present

## 2024-01-12 DIAGNOSIS — G5761 Lesion of plantar nerve, right lower limb: Secondary | ICD-10-CM | POA: Diagnosis not present

## 2024-01-23 DIAGNOSIS — K080 Exfoliation of teeth due to systemic causes: Secondary | ICD-10-CM | POA: Diagnosis not present

## 2024-04-10 ENCOUNTER — Other Ambulatory Visit: Payer: Self-pay | Admitting: Internal Medicine

## 2024-04-10 DIAGNOSIS — Z1231 Encounter for screening mammogram for malignant neoplasm of breast: Secondary | ICD-10-CM

## 2024-05-13 ENCOUNTER — Ambulatory Visit
Admission: RE | Admit: 2024-05-13 | Discharge: 2024-05-13 | Disposition: A | Discharge status: Home / Self Care | Source: Ambulatory Visit | Attending: Internal Medicine | Admitting: Internal Medicine

## 2024-05-13 DIAGNOSIS — Z1231 Encounter for screening mammogram for malignant neoplasm of breast: Secondary | ICD-10-CM | POA: Diagnosis not present

## 2024-07-24 DIAGNOSIS — G5761 Lesion of plantar nerve, right lower limb: Secondary | ICD-10-CM | POA: Diagnosis not present

## 2024-08-05 DIAGNOSIS — K080 Exfoliation of teeth due to systemic causes: Secondary | ICD-10-CM | POA: Diagnosis not present

## 2024-09-09 DIAGNOSIS — E785 Hyperlipidemia, unspecified: Secondary | ICD-10-CM | POA: Diagnosis not present

## 2024-10-24 ENCOUNTER — Telehealth: Payer: Self-pay | Admitting: Gastroenterology

## 2024-10-24 ENCOUNTER — Other Ambulatory Visit

## 2024-10-24 DIAGNOSIS — K52832 Lymphocytic colitis: Secondary | ICD-10-CM

## 2024-10-24 MED ORDER — BUDESONIDE 3 MG PO CPEP: 9.0000 mg | ORAL_CAPSULE | Freq: Every day | ORAL | 1 refills | Status: AC

## 2024-10-24 NOTE — Telephone Encounter (Signed)
 Patient is advised and agrees to this plan of care. No further questions at this time.  Office visit 11/18/24 at 9:30 am

## 2024-10-24 NOTE — Telephone Encounter (Signed)
 Please send Rx for Budesonide  9mg  daily X 30 days with 1 refill. Stat C.diff stool test and follow up in office visit with me or APP next available. Thanks

## 2024-10-24 NOTE — Telephone Encounter (Signed)
 Patient reports the grasual worsening of GI symptoms over the last 2 months or so. She startes with increased frequency of bowel movements which become more and more watery. She is now going constantly during the day and 3 or 4 times at night. She has explosive gas with the fecal urgency.

## 2024-10-24 NOTE — Telephone Encounter (Signed)
 Incoming call from pt regarding current GI symptoms. Pt stated she thinks she having colitis flare up and experiencing diarrhea. Pt requesting medical advice. Please advise. Thank you

## 2024-10-25 ENCOUNTER — Other Ambulatory Visit

## 2024-10-26 LAB — CLOSTRIDIUM DIFFICILE BY PCR: Toxigenic C. Difficile by PCR: NEGATIVE

## 2024-10-29 ENCOUNTER — Ambulatory Visit: Payer: Self-pay | Admitting: Gastroenterology

## 2024-11-18 ENCOUNTER — Ambulatory Visit: Admitting: Gastroenterology
